# Patient Record
Sex: Male | Born: 1965 | Race: White | Hispanic: No | Marital: Married | Smoking: Current every day smoker
Health system: Southern US, Community
[De-identification: ages and names within clinical notes are randomized; demographics above are authoritative.]

## PROBLEM LIST (undated history)

## (undated) DIAGNOSIS — K759 Inflammatory liver disease, unspecified: Secondary | ICD-10-CM

## (undated) DIAGNOSIS — K429 Umbilical hernia without obstruction or gangrene: Secondary | ICD-10-CM

## (undated) HISTORY — PX: BACK SURGERY: SHX140

## (undated) HISTORY — PX: APPENDECTOMY: SHX54

## (undated) HISTORY — PX: COLONOSCOPY W/ POLYPECTOMY: SHX1380

## (undated) HISTORY — PX: WRIST SURGERY: SHX841

## (undated) HISTORY — PX: KNEE SURGERY: SHX244

## (undated) HISTORY — PX: FACIAL RECONSTRUCTION SURGERY: SHX631

---

## 2009-05-29 ENCOUNTER — Ambulatory Visit (HOSPITAL_COMMUNITY): Admission: RE | Admit: 2009-05-29 | Discharge: 2009-05-29 | Payer: Self-pay | Admitting: Neurosurgery

## 2009-06-13 ENCOUNTER — Ambulatory Visit (HOSPITAL_COMMUNITY): Admission: RE | Admit: 2009-06-13 | Discharge: 2009-06-13 | Payer: Self-pay | Admitting: Neurosurgery

## 2009-07-27 ENCOUNTER — Emergency Department (HOSPITAL_COMMUNITY): Admission: EM | Admit: 2009-07-27 | Discharge: 2009-07-27 | Payer: Self-pay | Admitting: Emergency Medicine

## 2010-07-09 LAB — VALPROIC ACID LEVEL: Valproic Acid Lvl: 38.3 ug/mL — ABNORMAL LOW (ref 50.0–100.0)

## 2010-07-10 LAB — BASIC METABOLIC PANEL
BUN: 8 mg/dL (ref 6–23)
Calcium: 9.5 mg/dL (ref 8.4–10.5)
Creatinine, Ser: 1.14 mg/dL (ref 0.4–1.5)
GFR calc non Af Amer: >60 mL/min (ref >60)
Potassium: 4.8 mEq/L (ref 3.5–5.1)

## 2010-07-10 LAB — APTT: aPTT: 30 seconds (ref 24–37)

## 2010-07-10 LAB — URINALYSIS, ROUTINE W REFLEX MICROSCOPIC
Protein, ur: NEGATIVE mg/dL
Urobilinogen, UA: 0.2 mg/dL (ref 0.0–1.0)

## 2010-07-10 LAB — SURGICAL PCR SCREEN: MRSA, PCR: NEGATIVE

## 2010-07-10 LAB — CBC: WBC: 12.1 10*3/uL — ABNORMAL HIGH (ref 4.0–10.5)

## 2010-07-10 LAB — PROTIME-INR: INR: 0.98 (ref 0.00–1.49)

## 2010-10-07 IMAGING — CR DG LUMBAR SPINE 2-3V
1 series · 1 of 1 positions shown · non-contrast
Comparison: CT 05/29/2009

CLINICAL DATA: L4-5 decompression.

LUMBAR SPINE - 2-3 VIEW

[view not recorded]
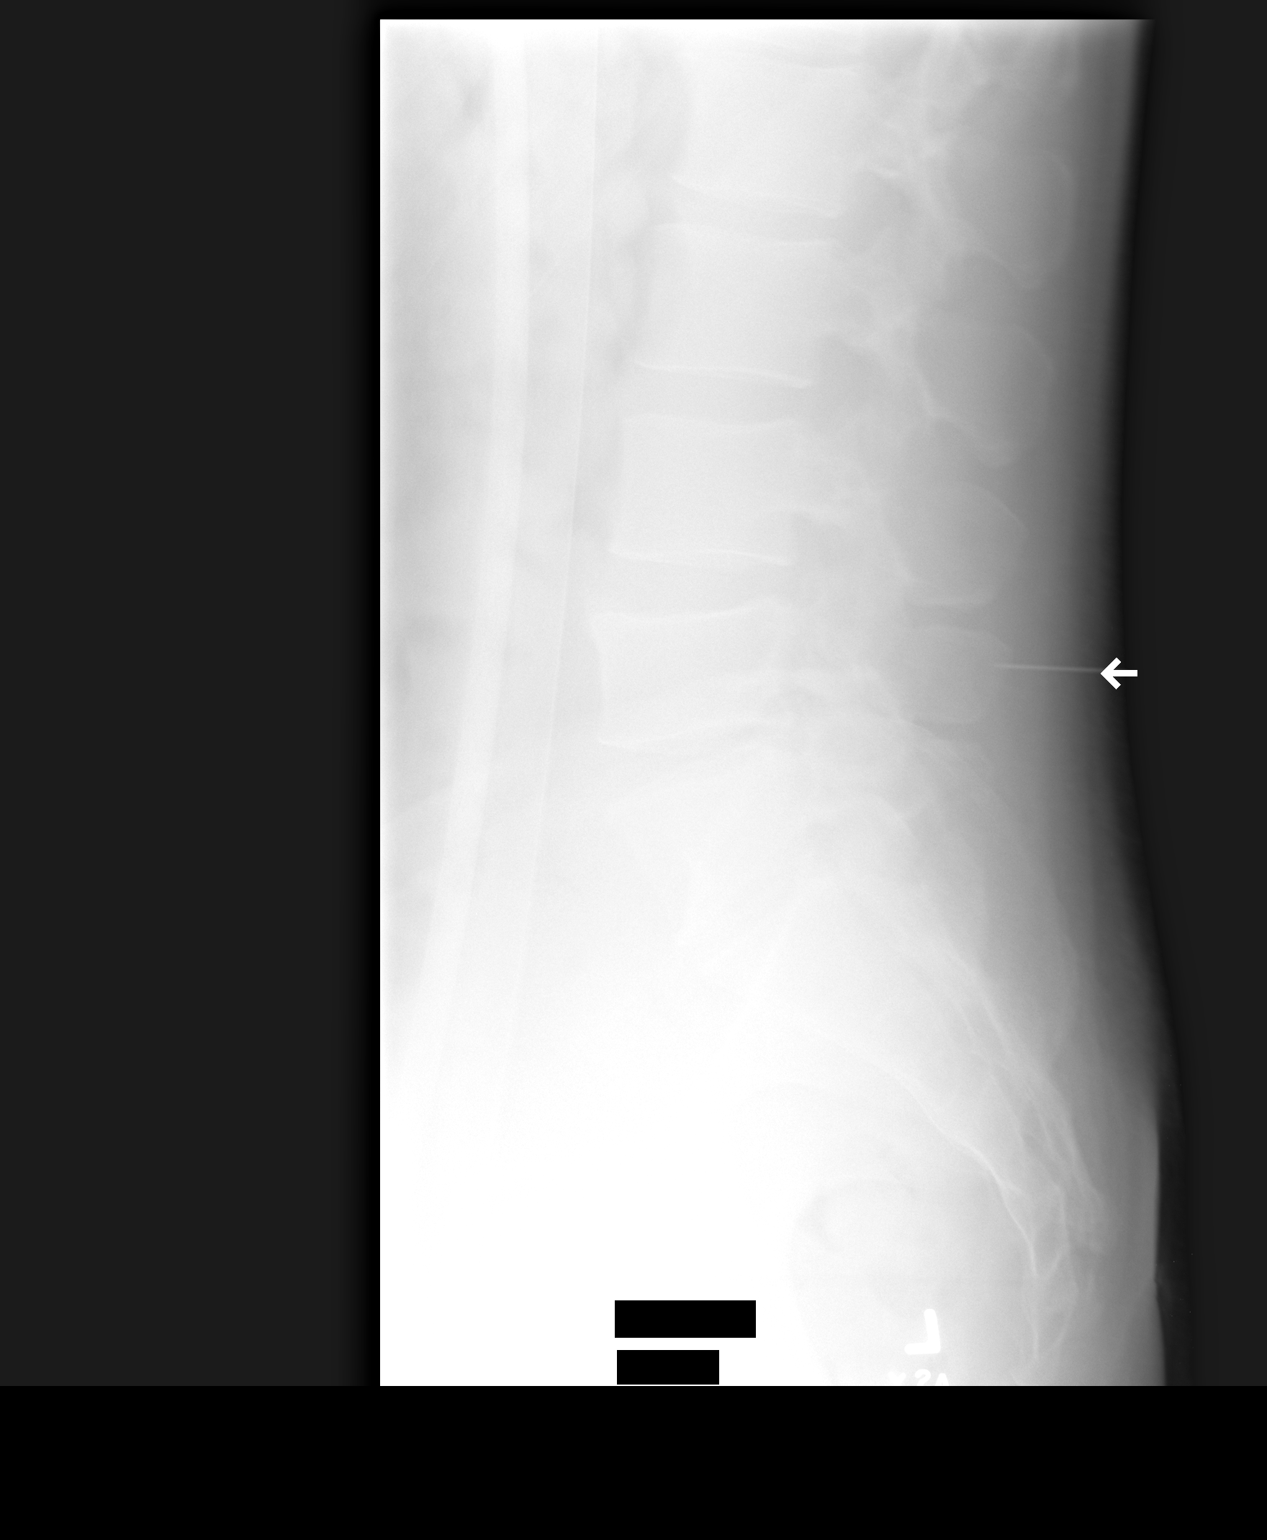

[1 of 1 positions shown; findings below may reference images not displayed]

FINDINGS: First lateral intraoperative image demonstrates a
posterior needle along the L4 spinous process.

Second lateral intraoperative image demonstrates posterior surgical
instruments at L4-5.
IMPRESSION: Intraoperative localization as above.

## 2018-02-15 DIAGNOSIS — T50901A Poisoning by unspecified drugs, medicaments and biological substances, accidental (unintentional), initial encounter: Secondary | ICD-10-CM

## 2018-02-15 DIAGNOSIS — F1910 Other psychoactive substance abuse, uncomplicated: Secondary | ICD-10-CM

## 2018-02-15 DIAGNOSIS — R45851 Suicidal ideations: Secondary | ICD-10-CM

## 2018-02-15 DIAGNOSIS — T50902A Poisoning by unspecified drugs, medicaments and biological substances, intentional self-harm, initial encounter: Secondary | ICD-10-CM

## 2018-05-06 ENCOUNTER — Emergency Department (HOSPITAL_COMMUNITY): Payer: Self-pay

## 2018-05-06 ENCOUNTER — Encounter (HOSPITAL_COMMUNITY): Payer: Self-pay

## 2018-05-06 ENCOUNTER — Observation Stay (HOSPITAL_COMMUNITY)
Admission: EM | Admit: 2018-05-06 | Discharge: 2018-05-08 | Disposition: A | Discharge status: Home / Self Care | Payer: Self-pay | Attending: General Surgery | Admitting: General Surgery

## 2018-05-06 ENCOUNTER — Other Ambulatory Visit: Payer: Self-pay

## 2018-05-06 DIAGNOSIS — S0285XA Fracture of orbit, unspecified, initial encounter for closed fracture: Secondary | ICD-10-CM

## 2018-05-06 DIAGNOSIS — S0083XA Contusion of other part of head, initial encounter: Secondary | ICD-10-CM

## 2018-05-06 DIAGNOSIS — T148XXA Other injury of unspecified body region, initial encounter: Secondary | ICD-10-CM

## 2018-05-06 DIAGNOSIS — S0231XA Fracture of orbital floor, right side, initial encounter for closed fracture: Secondary | ICD-10-CM | POA: Insufficient documentation

## 2018-05-06 DIAGNOSIS — S0181XA Laceration without foreign body of other part of head, initial encounter: Secondary | ICD-10-CM

## 2018-05-06 DIAGNOSIS — S022XXA Fracture of nasal bones, initial encounter for closed fracture: Principal | ICD-10-CM | POA: Diagnosis present

## 2018-05-06 LAB — COMPREHENSIVE METABOLIC PANEL
ALT: 37 U/L (ref 0–44)
ANION GAP: 10 (ref 5–15)
AST: 50 U/L — ABNORMAL HIGH (ref 15–41)
Albumin: 3.8 g/dL (ref 3.5–5.0)
Alkaline Phosphatase: 84 U/L (ref 38–126)
BUN: 14 mg/dL (ref 6–20)
CO2: 24 mmol/L (ref 22–32)
Calcium: 9.5 mg/dL (ref 8.9–10.3)
Chloride: 109 mmol/L (ref 98–111)
Creatinine, Ser: 1.22 mg/dL (ref 0.61–1.24)
GFR calc Af Amer: >60 mL/min (ref >60)
GFR calc non Af Amer: >60 mL/min (ref >60)
Glucose, Bld: 110 mg/dL — ABNORMAL HIGH (ref 70–99)
Potassium: 3.9 mmol/L (ref 3.5–5.1)
Sodium: 143 mmol/L (ref 135–145)
Total Bilirubin: 0.6 mg/dL (ref 0.3–1.2)
Total Protein: 6.6 g/dL (ref 6.5–8.1)

## 2018-05-06 LAB — ETHANOL: Alcohol, Ethyl (B): <10 mg/dL (ref <10)

## 2018-05-06 LAB — I-STAT CHEM 8, ED
BUN: 14 mg/dL (ref 6–20)
Calcium, Ion: 1.21 mmol/L (ref 1.15–1.40)
Chloride: 106 mmol/L (ref 98–111)
Creatinine, Ser: 1.1 mg/dL (ref 0.61–1.24)
Glucose, Bld: 103 mg/dL — ABNORMAL HIGH (ref 70–99)
HCT: 45 % (ref 39.0–52.0)
Hemoglobin: 15.3 g/dL (ref 13.0–17.0)
Potassium: 3.8 mmol/L (ref 3.5–5.1)
Sodium: 143 mmol/L (ref 135–145)
TCO2: 24 mmol/L (ref 22–32)

## 2018-05-06 LAB — CBC
HCT: 45.6 % (ref 39.0–52.0)
HEMOGLOBIN: 15 g/dL (ref 13.0–17.0)
MCH: 29.8 pg (ref 26.0–34.0)
MCHC: 32.9 g/dL (ref 30.0–36.0)
MCV: 90.7 fL (ref 80.0–100.0)
Platelets: 271 10*3/uL (ref 150–400)
RBC: 5.03 MIL/uL (ref 4.22–5.81)
RDW: 12.9 % (ref 11.5–15.5)
WBC: 21.1 10*3/uL — ABNORMAL HIGH (ref 4.0–10.5)
nRBC: 0 % (ref 0.0–0.2)

## 2018-05-06 LAB — PROTIME-INR
INR: 0.94
Prothrombin Time: 12.5 seconds (ref 11.4–15.2)

## 2018-05-06 LAB — SAMPLE TO BLOOD BANK

## 2018-05-06 LAB — I-STAT CG4 LACTIC ACID, ED: Lactic Acid, Venous: 2.36 mmol/L (ref 0.5–1.9)

## 2018-05-06 LAB — CDS SEROLOGY

## 2018-05-06 MED ORDER — IOPAMIDOL (ISOVUE-370) INJECTION 76%
INTRAVENOUS | Status: AC
  Filled 2018-05-06: qty 100

## 2018-05-06 MED ORDER — CEFAZOLIN SODIUM-DEXTROSE 2-4 GM/100ML-% IV SOLN
2.0000 g | Freq: Once | INTRAVENOUS | Status: AC
  Administered 2018-05-07: 2 g via INTRAVENOUS
  Filled 2018-05-06: qty 100

## 2018-05-06 MED ORDER — HYDROMORPHONE HCL 1 MG/ML IJ SOLN
INTRAMUSCULAR | Status: AC
  Administered 2018-05-06: 1 mg
  Filled 2018-05-06: qty 1

## 2018-05-06 MED ORDER — IOPAMIDOL (ISOVUE-370) INJECTION 76%
50.0000 mL | Freq: Once | INTRAVENOUS | Status: AC | PRN
  Administered 2018-05-06: 50 mL via INTRAVENOUS
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
## 2018-05-06 NOTE — ED Notes (Signed)
Dr Jacqulyn Bath informed of lactic acid results 2.36

## 2018-05-07 ENCOUNTER — Emergency Department (HOSPITAL_COMMUNITY): Payer: Self-pay

## 2018-05-07 DIAGNOSIS — S022XXA Fracture of nasal bones, initial encounter for closed fracture: Secondary | ICD-10-CM | POA: Diagnosis present

## 2018-05-07 LAB — CBC
HCT: 44.7 % (ref 39.0–52.0)
Hemoglobin: 15 g/dL (ref 13.0–17.0)
MCH: 30.9 pg (ref 26.0–34.0)
MCHC: 33.6 g/dL (ref 30.0–36.0)
MCV: 92.2 fL (ref 80.0–100.0)
Platelets: 271 10*3/uL (ref 150–400)
RBC: 4.85 MIL/uL (ref 4.22–5.81)
RDW: 13 % (ref 11.5–15.5)
WBC: 19.7 10*3/uL — ABNORMAL HIGH (ref 4.0–10.5)
nRBC: 0 % (ref 0.0–0.2)

## 2018-05-07 LAB — LIPASE, BLOOD: LIPASE: 79 U/L — AB (ref 11–51)

## 2018-05-07 LAB — URINALYSIS, ROUTINE W REFLEX MICROSCOPIC
Bilirubin Urine: NEGATIVE
Glucose, UA: NEGATIVE mg/dL
Hgb urine dipstick: NEGATIVE
KETONES UR: NEGATIVE mg/dL
Leukocytes, UA: NEGATIVE
Nitrite: NEGATIVE
Protein, ur: NEGATIVE mg/dL
Specific Gravity, Urine: 1.036 — ABNORMAL HIGH (ref 1.005–1.030)
pH: 5 (ref 5.0–8.0)

## 2018-05-07 LAB — HIV ANTIBODY (ROUTINE TESTING W REFLEX): HIV Screen 4th Generation wRfx: NONREACTIVE

## 2018-05-07 MED ORDER — HYDROMORPHONE HCL 1 MG/ML IJ SOLN
1.0000 mg | Freq: Once | INTRAMUSCULAR | Status: AC
  Administered 2018-05-07: 1 mg via INTRAVENOUS
  Filled 2018-05-07: qty 1

## 2018-05-07 MED ORDER — IOPAMIDOL (ISOVUE-370) INJECTION 76%
50.0000 mL | Freq: Once | INTRAVENOUS | Status: AC | PRN
  Administered 2018-05-07: 50 mL via INTRAVENOUS

## 2018-05-07 MED ORDER — MORPHINE SULFATE (PF) 2 MG/ML IV SOLN
2.0000 mg | INTRAVENOUS | Status: DC | PRN
  Administered 2018-05-07 – 2018-05-08 (×6): 2 mg via INTRAVENOUS
  Filled 2018-05-07: qty 2
  Filled 2018-05-07 (×5): qty 1

## 2018-05-07 MED ORDER — ENOXAPARIN SODIUM 40 MG/0.4ML ~~LOC~~ SOLN
40.0000 mg | Freq: Every day | SUBCUTANEOUS | Status: DC
  Administered 2018-05-07 – 2018-05-08 (×2): 40 mg via SUBCUTANEOUS
  Filled 2018-05-07 (×2): qty 0.4

## 2018-05-07 MED ORDER — LIDOCAINE-EPINEPHRINE (PF) 2 %-1:200000 IJ SOLN
20.0000 mL | Freq: Once | INTRAMUSCULAR | Status: AC
  Administered 2018-05-07: 20 mL via INTRADERMAL
  Filled 2018-05-07: qty 20

## 2018-05-07 MED ORDER — CHLORHEXIDINE GLUCONATE 0.12 % MT SOLN
15.0000 mL | Freq: Four times a day (QID) | OROMUCOSAL | Status: DC
  Administered 2018-05-07 – 2018-05-08 (×4): 15 mL via OROMUCOSAL
  Filled 2018-05-07 (×3): qty 15

## 2018-05-07 MED ORDER — IBUPROFEN 100 MG/5ML PO SUSP
800.0000 mg | Freq: Three times a day (TID) | ORAL | Status: DC | PRN
  Filled 2018-05-07: qty 40

## 2018-05-07 MED ORDER — ACETAMINOPHEN 160 MG/5ML PO SOLN
650.0000 mg | ORAL | Status: DC
  Administered 2018-05-07 – 2018-05-08 (×5): 650 mg via ORAL
  Filled 2018-05-07 (×5): qty 20.3

## 2018-05-07 MED ORDER — BACITRACIN ZINC 500 UNIT/GM EX OINT
TOPICAL_OINTMENT | Freq: Two times a day (BID) | CUTANEOUS | Status: DC
  Administered 2018-05-07 – 2018-05-08 (×3): via TOPICAL
  Filled 2018-05-07 (×2): qty 28.4

## 2018-05-07 MED ORDER — SODIUM CHLORIDE 0.9 % IV SOLN: INTRAVENOUS | Status: DC

## 2018-05-07 MED ORDER — WHITE PETROLATUM EX OINT
TOPICAL_OINTMENT | CUTANEOUS | Status: AC
  Administered 2018-05-07: 0.2
  Filled 2018-05-07: qty 28.35

## 2018-05-07 MED ORDER — OXYCODONE HCL 5 MG/5ML PO SOLN: 5.0000 mg | ORAL | Status: DC | PRN

## 2018-05-07 MED ORDER — HYDRALAZINE HCL 20 MG/ML IJ SOLN: 10.0000 mg | INTRAMUSCULAR | Status: DC | PRN

## 2018-05-07 MED ORDER — IOPAMIDOL (ISOVUE-370) INJECTION 76%: 50.0000 mL | Freq: Once | INTRAVENOUS | Status: DC | PRN

## 2018-05-07 MED ORDER — NICOTINE 7 MG/24HR TD PT24
7.0000 mg | MEDICATED_PATCH | Freq: Every day | TRANSDERMAL | Status: DC
  Administered 2018-05-07 – 2018-05-08 (×2): 7 mg via TRANSDERMAL
  Filled 2018-05-07 (×2): qty 1

## 2018-05-07 MED ORDER — ONDANSETRON 4 MG PO TBDP: 4.0000 mg | ORAL_TABLET | Freq: Four times a day (QID) | ORAL | Status: DC | PRN

## 2018-05-07 MED ORDER — OXYCODONE HCL 5 MG/5ML PO SOLN
5.0000 mg | ORAL | Status: DC | PRN
  Administered 2018-05-07 – 2018-05-08 (×4): 10 mg via ORAL
  Filled 2018-05-07 (×4): qty 10

## 2018-05-07 MED ORDER — ACETAMINOPHEN 325 MG PO TABS: 650.0000 mg | ORAL_TABLET | ORAL | Status: DC | PRN

## 2018-05-07 MED ORDER — IBUPROFEN 200 MG PO TABS: 800.0000 mg | ORAL_TABLET | Freq: Three times a day (TID) | ORAL | Status: DC | PRN

## 2018-05-07 MED ORDER — METOPROLOL TARTRATE 5 MG/5ML IV SOLN: 5.0000 mg | Freq: Four times a day (QID) | INTRAVENOUS | Status: DC | PRN

## 2018-05-07 MED ORDER — OXYCODONE HCL 5 MG PO TABS
5.0000 mg | ORAL_TABLET | ORAL | Status: DC | PRN
  Administered 2018-05-07: 5 mg via ORAL
  Filled 2018-05-07: qty 1

## 2018-05-07 MED ORDER — LIDOCAINE HCL (PF) 1 % IJ SOLN
INTRAMUSCULAR | Status: AC
  Administered 2018-05-07: 05:00:00
  Filled 2018-05-07: qty 30

## 2018-05-07 MED ORDER — ONDANSETRON HCL 4 MG/2ML IJ SOLN: 4.0000 mg | Freq: Four times a day (QID) | INTRAMUSCULAR | Status: DC | PRN

## 2018-05-07 MED ORDER — SODIUM CHLORIDE 0.9 % IV SOLN
INTRAVENOUS | Status: DC
  Administered 2018-05-07 (×3): via INTRAVENOUS

## 2018-05-07 MED ORDER — ACETAMINOPHEN 160 MG/5ML PO SOLN: 650.0000 mg | ORAL | Status: DC | PRN

## 2018-05-07 NOTE — Plan of Care (Signed)

## 2018-05-07 NOTE — ED Notes (Signed)
Pt unhooked himself from the monitors and demanded water or else he wanted to leave.  Pt made aware he needed to wait for the CT results prior to having any water.  Patient upset and stated he wants to leave.  Patient sitting on the side of the bed at this time. MD aware of situation.

## 2018-05-07 NOTE — Progress Notes (Signed)
Pt states he wants a cigarette. Informed this is a non-smoking hospital and I would call MD to ask for orders to leave floor. Staff member saw pt fully dressed and leaving unit. Staff member told pt he is not to leave floor without MD orders. Pt left building and whereabouts unknown. Notified security and charge nurse who are searching for pt now. Paged MD.

## 2018-05-07 NOTE — Progress Notes (Signed)
Pt located and returned to unit. States he pulled his own IV and trashed to so he could leave building and smoke a cigarette. States he is stressed about being homeless and recently stopped using illicit drugs. Pt offered emotional support and encouraged to stay within facility. Agrees to stay in hospital for care. SW request made. MD paged once more to notify of pt leaving facility.

## 2018-05-07 NOTE — ED Notes (Signed)
Dr. Ward at bedside.

## 2018-05-07 NOTE — Progress Notes (Signed)
Pt disconnected self from IVF. Discouraged such behavior and educated on purpose of IVF. Line flushed and IVF resumed. Upon return to room pt has once again disconnected himself from IVF. Refuses to keep IVF running. Disconnected and flushed line. Notified MD.

## 2018-05-07 NOTE — ED Notes (Signed)
Lab called and confirmed they can add on lipase.
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
## 2018-05-07 NOTE — ED Notes (Signed)
ED Provider at bedside. 

## 2018-05-07 NOTE — Progress Notes (Signed)
Pt was offered to be a on private status. Security came to bedside to discuss triple x option and pt declined stating no one would be to see him. Later overheard pt talking to someone on the phone informing them of his location and explaining his reason for admission. Did inform pt about safety procedures we can invoke if he feels unsafe. Pt up and ambulating without difficulty. Will continue to monitor.
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
## 2018-05-07 NOTE — ED Provider Notes (Signed)
LACERATION REPAIR Performed by: Arthor Captain Authorized by: Arthor Captain Consent: Verbal consent obtained. Risks and benefits: risks, benefits and alternatives were discussed Consent given by: patient Patient identity confirmed: provided demographic data Prepped and Draped in normal sterile fashion Wound explored  Laceration Location: R eyebrow  Laceration Length: 7 cm  No Foreign Bodies seen or palpated  Anesthesia: local infiltration  Local anesthetic: lidocaine 1% w epinephrine  Anesthetic total: 4 ml  Irrigation method: syringe Amount of cleaning: standard  Skin closure: 5.0 prolene  Number of sutures: 9  Technique: SI  Patient tolerance: Patient tolerated the procedure well with no immediate complications.   LACERATION REPAIR Performed by: Arthor Captain Authorized by: Arthor Captain Consent: Verbal consent obtained. Risks and benefits: risks, benefits and alternatives were discussed Consent given by: patient Patient identity confirmed: provided demographic data Prepped and Draped in normal sterile fashion Wound explored  Laceration Location: R cheek  Laceration Length: 2 cm  No Foreign Bodies seen or palpated  Anesthesia: local infiltration  Local anesthetic: lidocaine 1% w epinephrine  Anesthetic total: 1 ml  Irrigation method: syringe Amount of cleaning: standard  Skin closure: 5.0 prolene  Number of sutures: 1  Technique: SI  Patient tolerance: Patient tolerated the procedure well with no immediate complications.  LACERATION REPAIR Performed by: Arthor Captain Authorized by: Arthor Captain Consent: Verbal consent obtained. Risks and benefits: risks, benefits and alternatives were discussed Consent given by: patient Patient identity confirmed: provided demographic data Prepped and Draped in normal sterile fashion Wound explored  Laceration Location: R upper lip  Laceration Length: 4 cm  Intermediate laceration complexity -  stellate and involving the vermilion border  No Foreign Bodies seen or palpated  Anesthesia: local infiltration  Local anesthetic: lidocaine 1% w/ epinephrine  Anesthetic total: 3 ml  Irrigation method: syringe Amount of cleaning: standard  Skin closure: 5.0 prolene  Number of sutures: 5  Technique: SI  Patient tolerance: Patient tolerated the procedure well with no immediate complications.  LACERATION REPAIR Performed by: Arthor Captain Authorized by: Arthor Captain Consent: Verbal consent obtained. Risks and benefits: risks, benefits and alternatives were discussed Consent given by: patient Patient identity confirmed: provided demographic data Prepped and Draped in normal sterile fashion Wound explored  Laceration Location: Mid-upper lip  Laceration Length: 1cm  No Foreign Bodies seen or palpated  Anesthesia: local infiltration  Local anesthetic: lidocaine 1% w/ epinephrine  Anesthetic total: 1 ml  Irrigation method: syringe Amount of cleaning: standard  Skin closure: 5.0 prolene  Number of sutures: 1  Technique: SI  Patient tolerance: Patient tolerated the procedure well with no immediate complications.   LACERATION REPAIR Performed by: Arthor Captain Authorized by: Arthor Captain Consent: Verbal consent obtained. Risks and benefits: risks, benefits and alternatives were discussed Consent given by: patient Patient identity confirmed: provided demographic data Prepped and Draped in normal sterile fashion Wound explored  Laceration Location: left upper lip  Laceration Length: 3 cm  No Foreign Bodies seen or palpated  Anesthesia: local infiltration  Local anesthetic: lidocaine 1% w epinephrine  Anesthetic total: 2 ml  Irrigation method: syringe Amount of cleaning: standard  Skin closure: 5.0 prolene  Number of sutures: 4  Technique:si   Patient tolerance: Patient tolerated the procedure well with no immediate  complications.   LACERATION REPAIR Performed by: Arthor Captain Authorized by: Arthor Captain Consent: Verbal consent obtained. Risks and benefits: risks, benefits and alternatives were discussed Consent given by: patient Patient identity confirmed: provided demographic data Prepped and Draped in normal  sterile fashion Wound explored  Laceration Location: gingival surface of the upper lip  Laceration Length: 4 cm  No Foreign Bodies seen or palpated  Anesthesia: local infiltration  Local anesthetic: lidocaine 1% w/ epinephrine  Anesthetic total: 2 ml  Irrigation method: syringe Amount of cleaning: standard  Skin closure: 3.0 plain gut  Number of sutures: 1  Technique: SI  Patient tolerance: Patient tolerated the procedure well with no immediate complications.   LACERATION REPAIR Performed by: Arthor Captain Authorized by: Arthor Captain Consent: Verbal consent obtained. Risks and benefits: risks, benefits and alternatives were discussed Consent given by: patient Patient identity confirmed: provided demographic data Prepped and Draped in normal sterile fashion Wound explored  Laceration Location:  Nasal tip  Laceration Length: 0.5 cm  No Foreign Bodies seen or palpated  Anesthesia: local infiltration  Local anesthetic: lidocaine 1% w/o epinephrine  Anesthetic total: 1 ml  Irrigation method: syringe Amount of cleaning: standard  Skin closure: 6.0 prolene  Number of sutures: 1  Technique: SI  Patient tolerance: Patient tolerated the procedure well with no immediate complications.      Arthor Captain, PA-C 05/07/18 [phone]
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
## 2018-05-07 NOTE — ED Notes (Signed)
PA Harris remains at bedside suturing

## 2018-05-07 NOTE — ED Notes (Addendum)
PA Harris at bedside to suture lacerations, patient to be transported to inpatient bed after suturing is finished.

## 2018-05-07 NOTE — Progress Notes (Signed)
Spoke with MD and notified of pt exit of building. New orders obtained and noted.
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
## 2018-05-08 LAB — CBC
HCT: 39.4 % (ref 39.0–52.0)
Hemoglobin: 13 g/dL (ref 13.0–17.0)
MCH: 30 pg (ref 26.0–34.0)
MCHC: 33 g/dL (ref 30.0–36.0)
MCV: 91 fL (ref 80.0–100.0)
NRBC: 0 % (ref 0.0–0.2)
Platelets: 232 10*3/uL (ref 150–400)
RBC: 4.33 MIL/uL (ref 4.22–5.81)
RDW: 13 % (ref 11.5–15.5)
WBC: 10.3 10*3/uL (ref 4.0–10.5)

## 2018-05-08 LAB — BASIC METABOLIC PANEL
ANION GAP: 8 (ref 5–15)
BUN: 13 mg/dL (ref 6–20)
CO2: 25 mmol/L (ref 22–32)
Calcium: 8.3 mg/dL — ABNORMAL LOW (ref 8.9–10.3)
Chloride: 109 mmol/L (ref 98–111)
Creatinine, Ser: 1.05 mg/dL (ref 0.61–1.24)
GFR calc Af Amer: >60 mL/min (ref >60)
GFR calc non Af Amer: >60 mL/min (ref >60)
Glucose, Bld: 93 mg/dL (ref 70–99)
POTASSIUM: 3.8 mmol/L (ref 3.5–5.1)
Sodium: 142 mmol/L (ref 135–145)

## 2018-05-08 MED ORDER — BACITRACIN ZINC 500 UNIT/GM EX OINT: TOPICAL_OINTMENT | Freq: Two times a day (BID) | CUTANEOUS | 0 refills | Status: DC

## 2018-05-08 MED ORDER — ACETAMINOPHEN 500 MG PO TABS: 1000.0000 mg | ORAL_TABLET | Freq: Four times a day (QID) | ORAL | 2 refills | Status: AC | PRN

## 2018-05-08 MED ORDER — OXYCODONE HCL 5 MG PO TABS: 5.0000 mg | ORAL_TABLET | ORAL | 0 refills | Status: DC | PRN

## 2018-05-08 NOTE — Plan of Care (Signed)
  Problem: Pain Managment: Goal: General experience of comfort will improve Outcome: Progressing   Problem: Safety: Goal: Ability to remain free from injury will improve Outcome: Progressing   
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
## 2018-05-08 NOTE — Progress Notes (Signed)
   Plastic Surgery  Events noted. If patient able to discharge, I have made appt for patient in clinic 1.27.20 at 1 pm for suture removal and surgical planning.  Glenna FellowsBrinda Meli Faley, MD [facility] Plastic & Reconstructive Surgery [phone], pin [phone]

## 2018-05-08 NOTE — Discharge Instructions (Signed)
Ok to shower, vaseline or bacitracin to facial laceration repairs. Refrain from nose blowing, ok to use saline nasal spray to help with clots. Counseled patient edema will get worse for 48-72 hours prior to subsiding. Ice packs for comfort. Keep head elevated. Ok to brush teeth with soft tooth brush and rinse after each meal.

## 2021-08-15 ENCOUNTER — Ambulatory Visit (INDEPENDENT_AMBULATORY_CARE_PROVIDER_SITE_OTHER): Payer: Self-pay

## 2021-08-15 ENCOUNTER — Ambulatory Visit (INDEPENDENT_AMBULATORY_CARE_PROVIDER_SITE_OTHER): Payer: Self-pay | Admitting: Family Medicine

## 2021-08-15 VITALS — BP 160/88 | HR 91 | Ht 72.0 in | Wt 228.8 lb

## 2021-08-15 DIAGNOSIS — M795 Residual foreign body in soft tissue: Secondary | ICD-10-CM | POA: Insufficient documentation

## 2021-08-15 DIAGNOSIS — S63105A Unspecified dislocation of left thumb, initial encounter: Secondary | ICD-10-CM

## 2021-08-15 DIAGNOSIS — M79645 Pain in left finger(s): Secondary | ICD-10-CM

## 2021-08-15 MED ORDER — MELOXICAM 15 MG PO TABS: 15.0000 mg | ORAL_TABLET | Freq: Every day | ORAL | 0 refills | Status: AC
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
## 2021-08-15 NOTE — Patient Instructions (Addendum)
Thank you for coming in today.  ? ?I've referred you to Orthopedic Surgery.  Let us know if you don't hear from them in one week.  ? ?

## 2021-08-18 NOTE — Progress Notes (Signed)
The left thumb does look to be subluxed (partially out of the joint) to dislocated.  The splint that you are wearing is appropriate for now but I do think that you need to see a surgeon.

## 2021-08-19 ENCOUNTER — Encounter: Payer: Self-pay | Admitting: Orthopedic Surgery

## 2021-08-19 ENCOUNTER — Ambulatory Visit (INDEPENDENT_AMBULATORY_CARE_PROVIDER_SITE_OTHER): Payer: 59 | Admitting: Orthopedic Surgery

## 2021-08-19 DIAGNOSIS — S63045A Dislocation of carpometacarpal joint of left thumb, initial encounter: Secondary | ICD-10-CM | POA: Diagnosis not present

## 2021-08-25 DIAGNOSIS — S63045A Dislocation of carpometacarpal joint of left thumb, initial encounter: Secondary | ICD-10-CM | POA: Insufficient documentation
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
## 2021-09-02 ENCOUNTER — Telehealth: Payer: Self-pay | Admitting: Orthopedic Surgery

## 2021-09-02 NOTE — Telephone Encounter (Signed)
Patient called to let you know he is really hurting now. Patient changed his phone number, and forgot to let our office know. Please call patient to discuss next steps since it has been awhile since he was last seen. Patient's # [phone] ?

## 2021-09-02 NOTE — Telephone Encounter (Signed)
Please advise on the next steps. ?

## 2021-09-18 ENCOUNTER — Telehealth: Payer: Self-pay | Admitting: Orthopedic Surgery

## 2021-09-18 NOTE — Telephone Encounter (Signed)
Patient left another message stating he was calling back since surgery has not been scheduled. Please call patient to discuss increased symptoms, and if scheduling surgery is next step.

## 2021-09-19 ENCOUNTER — Encounter (HOSPITAL_COMMUNITY): Payer: Self-pay | Admitting: Orthopedic Surgery

## 2021-09-19 ENCOUNTER — Other Ambulatory Visit: Payer: Self-pay

## 2021-09-19 NOTE — Progress Notes (Signed)
Mr. Engelhard denies chest pain or shortness of breath.  Patient denies having any s/s of Covid in his household.  Patient denies any known exposure to Covid.  Mr Terry do not have a PCP.

## 2021-09-22 ENCOUNTER — Encounter (HOSPITAL_COMMUNITY): Payer: Self-pay | Admitting: Orthopedic Surgery

## 2021-09-22 ENCOUNTER — Ambulatory Visit (HOSPITAL_COMMUNITY): Payer: 59

## 2021-09-22 ENCOUNTER — Other Ambulatory Visit: Payer: Self-pay

## 2021-09-22 ENCOUNTER — Encounter (HOSPITAL_COMMUNITY)
Admission: RE | Disposition: A | Discharge status: Home / Self Care | Payer: Self-pay | Source: Home / Self Care | Attending: Orthopedic Surgery

## 2021-09-22 ENCOUNTER — Ambulatory Visit (HOSPITAL_BASED_OUTPATIENT_CLINIC_OR_DEPARTMENT_OTHER): Payer: 59 | Admitting: Anesthesiology

## 2021-09-22 ENCOUNTER — Ambulatory Visit (HOSPITAL_COMMUNITY): Payer: 59 | Admitting: Anesthesiology

## 2021-09-22 ENCOUNTER — Ambulatory Visit (HOSPITAL_COMMUNITY)
Admission: RE | Admit: 2021-09-22 | Discharge: 2021-09-22 | Disposition: A | Discharge status: Home / Self Care | Payer: 59 | Attending: Orthopedic Surgery | Admitting: Orthopedic Surgery

## 2021-09-22 DIAGNOSIS — S63046A Dislocation of carpometacarpal joint of unspecified thumb, initial encounter: Secondary | ICD-10-CM | POA: Insufficient documentation

## 2021-09-22 DIAGNOSIS — Y92410 Unspecified street and highway as the place of occurrence of the external cause: Secondary | ICD-10-CM | POA: Insufficient documentation

## 2021-09-22 DIAGNOSIS — F17210 Nicotine dependence, cigarettes, uncomplicated: Secondary | ICD-10-CM | POA: Diagnosis not present

## 2021-09-22 DIAGNOSIS — E669 Obesity, unspecified: Secondary | ICD-10-CM

## 2021-09-22 DIAGNOSIS — S63045A Dislocation of carpometacarpal joint of left thumb, initial encounter: Secondary | ICD-10-CM

## 2021-09-22 DIAGNOSIS — Z6831 Body mass index (BMI) 31.0-31.9, adult: Secondary | ICD-10-CM | POA: Diagnosis not present

## 2021-09-22 HISTORY — PX: CLOSED REDUCTION FINGER WITH PERCUTANEOUS PINNING: SHX5612

## 2021-09-22 HISTORY — DX: Inflammatory liver disease, unspecified: K75.9

## 2021-09-22 HISTORY — DX: Umbilical hernia without obstruction or gangrene: K42.9

## 2021-09-22 LAB — COMPREHENSIVE METABOLIC PANEL
ALT: 33 U/L (ref 0–44)
AST: 37 U/L (ref 15–41)
Albumin: 3.5 g/dL (ref 3.5–5.0)
Alkaline Phosphatase: 106 U/L (ref 38–126)
Anion gap: 7 (ref 5–15)
BUN: 12 mg/dL (ref 6–20)
CO2: 22 mmol/L (ref 22–32)
Calcium: 8.9 mg/dL (ref 8.9–10.3)
Chloride: 111 mmol/L (ref 98–111)
Creatinine, Ser: 1.03 mg/dL (ref 0.61–1.24)
GFR, Estimated: >60 mL/min (ref >60)
Glucose, Bld: 98 mg/dL (ref 70–99)
Potassium: 4.6 mmol/L (ref 3.5–5.1)
Sodium: 140 mmol/L (ref 135–145)
Total Bilirubin: 0.8 mg/dL (ref 0.3–1.2)
Total Protein: 6.5 g/dL (ref 6.5–8.1)

## 2021-09-22 LAB — CBC
HCT: 50.4 % (ref 39.0–52.0)
Hemoglobin: 17.1 g/dL — ABNORMAL HIGH (ref 13.0–17.0)
MCH: 30.4 pg (ref 26.0–34.0)
MCHC: 33.9 g/dL (ref 30.0–36.0)
MCV: 89.5 fL (ref 80.0–100.0)
Platelets: 298 10*3/uL (ref 150–400)
RBC: 5.63 MIL/uL (ref 4.22–5.81)
RDW: 13.9 % (ref 11.5–15.5)
WBC: 9.9 10*3/uL (ref 4.0–10.5)
nRBC: 0 % (ref 0.0–0.2)

## 2021-09-22 SURGERY — CLOSED REDUCTION, FINGER, WITH PERCUTANEOUS PINNING
Anesthesia: General | Site: Thumb | Laterality: Left

## 2021-09-22 MED ORDER — PROPOFOL 10 MG/ML IV BOLUS
INTRAVENOUS | Status: DC | PRN
  Administered 2021-09-22: 300 mg via INTRAVENOUS

## 2021-09-22 MED ORDER — OXYCODONE HCL 5 MG PO TABS: 5.0000 mg | ORAL_TABLET | ORAL | 0 refills | Status: DC | PRN

## 2021-09-22 MED ORDER — FENTANYL CITRATE (PF) 250 MCG/5ML IJ SOLN
INTRAMUSCULAR | Status: AC
  Filled 2021-09-22: qty 5

## 2021-09-22 MED ORDER — HYDROMORPHONE HCL 1 MG/ML IJ SOLN
0.2500 mg | INTRAMUSCULAR | Status: DC | PRN
  Administered 2021-09-22 (×4): 0.5 mg via INTRAVENOUS

## 2021-09-22 MED ORDER — ONDANSETRON HCL 4 MG/2ML IJ SOLN
INTRAMUSCULAR | Status: DC | PRN
  Administered 2021-09-22: 4 mg via INTRAVENOUS

## 2021-09-22 MED ORDER — DEXMEDETOMIDINE HCL IN NACL 80 MCG/20ML IV SOLN
INTRAVENOUS | Status: AC
  Filled 2021-09-22: qty 20

## 2021-09-22 MED ORDER — MIDAZOLAM HCL 2 MG/2ML IJ SOLN
INTRAMUSCULAR | Status: AC
  Filled 2021-09-22: qty 2

## 2021-09-22 MED ORDER — ONDANSETRON HCL 4 MG/2ML IJ SOLN
INTRAMUSCULAR | Status: AC
  Filled 2021-09-22: qty 2

## 2021-09-22 MED ORDER — LACTATED RINGERS IV SOLN: INTRAVENOUS | Status: DC

## 2021-09-22 MED ORDER — CEFAZOLIN SODIUM-DEXTROSE 2-4 GM/100ML-% IV SOLN
2.0000 g | INTRAVENOUS | Status: AC
  Administered 2021-09-22: 2 g via INTRAVENOUS
  Filled 2021-09-22: qty 100

## 2021-09-22 MED ORDER — HYDROMORPHONE HCL 1 MG/ML IJ SOLN
INTRAMUSCULAR | Status: AC
  Filled 2021-09-22: qty 1

## 2021-09-22 MED ORDER — ACETAMINOPHEN 10 MG/ML IV SOLN
INTRAVENOUS | Status: AC
  Filled 2021-09-22: qty 100

## 2021-09-22 MED ORDER — OXYCODONE HCL 5 MG PO TABS: 5.0000 mg | ORAL_TABLET | Freq: Once | ORAL | Status: AC | PRN

## 2021-09-22 MED ORDER — PROMETHAZINE HCL 25 MG/ML IJ SOLN: 6.2500 mg | INTRAMUSCULAR | Status: DC | PRN

## 2021-09-22 MED ORDER — PROPOFOL 10 MG/ML IV BOLUS
INTRAVENOUS | Status: AC
  Filled 2021-09-22: qty 20

## 2021-09-22 MED ORDER — MIDAZOLAM HCL 2 MG/2ML IJ SOLN
INTRAMUSCULAR | Status: DC | PRN
  Administered 2021-09-22: 2 mg via INTRAVENOUS

## 2021-09-22 MED ORDER — CHLORHEXIDINE GLUCONATE 0.12 % MT SOLN
15.0000 mL | Freq: Once | OROMUCOSAL | Status: AC
  Administered 2021-09-22: 15 mL via OROMUCOSAL
  Filled 2021-09-22: qty 15

## 2021-09-22 MED ORDER — DEXMEDETOMIDINE (PRECEDEX) IN NS 20 MCG/5ML (4 MCG/ML) IV SYRINGE
PREFILLED_SYRINGE | INTRAVENOUS | Status: DC | PRN
  Administered 2021-09-22: 12 ug via INTRAVENOUS

## 2021-09-22 MED ORDER — LIDOCAINE 2% (20 MG/ML) 5 ML SYRINGE
INTRAMUSCULAR | Status: DC | PRN
  Administered 2021-09-22: 60 mg via INTRAVENOUS

## 2021-09-22 MED ORDER — OXYCODONE HCL 5 MG/5ML PO SOLN
ORAL | Status: AC
  Filled 2021-09-22: qty 5

## 2021-09-22 MED ORDER — ORAL CARE MOUTH RINSE: 15.0000 mL | Freq: Once | OROMUCOSAL | Status: AC

## 2021-09-22 MED ORDER — DEXAMETHASONE SODIUM PHOSPHATE 10 MG/ML IJ SOLN
INTRAMUSCULAR | Status: DC | PRN
  Administered 2021-09-22: 10 mg via INTRAVENOUS

## 2021-09-22 MED ORDER — AMISULPRIDE (ANTIEMETIC) 5 MG/2ML IV SOLN: 10.0000 mg | Freq: Once | INTRAVENOUS | Status: DC | PRN

## 2021-09-22 MED ORDER — MEPERIDINE HCL 25 MG/ML IJ SOLN: 6.2500 mg | INTRAMUSCULAR | Status: DC | PRN

## 2021-09-22 MED ORDER — DEXAMETHASONE SODIUM PHOSPHATE 10 MG/ML IJ SOLN
INTRAMUSCULAR | Status: AC
  Filled 2021-09-22: qty 1

## 2021-09-22 MED ORDER — LIDOCAINE 2% (20 MG/ML) 5 ML SYRINGE
INTRAMUSCULAR | Status: AC
Start: 2021-09-22 — End: ?
  Filled 2021-09-22: qty 5

## 2021-09-22 MED ORDER — OXYCODONE HCL 5 MG/5ML PO SOLN
5.0000 mg | Freq: Once | ORAL | Status: AC | PRN
  Administered 2021-09-22: 5 mg via ORAL

## 2021-09-22 MED ORDER — FENTANYL CITRATE (PF) 250 MCG/5ML IJ SOLN
INTRAMUSCULAR | Status: DC | PRN
  Administered 2021-09-22 (×4): 50 ug via INTRAVENOUS

## 2021-09-22 MED ORDER — 0.9 % SODIUM CHLORIDE (POUR BTL) OPTIME
TOPICAL | Status: DC | PRN
  Administered 2021-09-22: 1000 mL

## 2021-09-22 MED ORDER — ACETAMINOPHEN 10 MG/ML IV SOLN
INTRAVENOUS | Status: DC | PRN
  Administered 2021-09-22: 1000 mg via INTRAVENOUS

## 2021-09-22 SURGICAL SUPPLY — 46 items
BAG COUNTER SPONGE SURGICOUNT (BAG) ×1 IMPLANT
BLADE CLIPPER SURG (BLADE) IMPLANT
BLADE SURG 15 STRL LF DISP TIS (BLADE) IMPLANT
BLADE SURG 15 STRL SS (BLADE) ×1
BNDG ELASTIC 3X5.8 VLCR STR LF (GAUZE/BANDAGES/DRESSINGS) ×1 IMPLANT
BNDG ELASTIC 4X5.8 VLCR STR LF (GAUZE/BANDAGES/DRESSINGS) ×2 IMPLANT
BNDG ESMARK 4X9 LF (GAUZE/BANDAGES/DRESSINGS) ×2 IMPLANT
BNDG GAUZE ELAST 4 BULKY (GAUZE/BANDAGES/DRESSINGS) ×1 IMPLANT
CORD BIPOLAR FORCEPS 12FT (ELECTRODE) ×2 IMPLANT
COVER SURGICAL LIGHT HANDLE (MISCELLANEOUS) ×2 IMPLANT
CUFF TOURN SGL QUICK 18X4 (TOURNIQUET CUFF) ×2 IMPLANT
CUFF TOURN SGL QUICK 24 (TOURNIQUET CUFF)
CUFF TRNQT CYL 24X4X16.5-23 (TOURNIQUET CUFF) IMPLANT
DRAPE OEC MINIVIEW 54X84 (DRAPES) ×2 IMPLANT
DRSG TEGADERM 4X4.75 (GAUZE/BANDAGES/DRESSINGS) ×1 IMPLANT
ELECT REM PT RETURN 9FT ADLT (ELECTROSURGICAL)
ELECTRODE REM PT RTRN 9FT ADLT (ELECTROSURGICAL) IMPLANT
GAUZE 4X4 16PLY ~~LOC~~+RFID DBL (SPONGE) ×2 IMPLANT
GAUZE SPONGE 4X4 12PLY STRL (GAUZE/BANDAGES/DRESSINGS) ×1 IMPLANT
GAUZE SPONGE 4X4 12PLY STRL LF (GAUZE/BANDAGES/DRESSINGS) ×1 IMPLANT
GAUZE XEROFORM 1X8 LF (GAUZE/BANDAGES/DRESSINGS) ×1 IMPLANT
GOWN STRL REUS W/ TWL LRG LVL3 (GOWN DISPOSABLE) ×2 IMPLANT
GOWN STRL REUS W/TWL LRG LVL3 (GOWN DISPOSABLE) ×2
K-WIRE .062 (WIRE)
K-WIRE 1.4X100 (WIRE)
K-WIRE FX7.25X.062XNS KRSH (WIRE)
K-WIRE SURGICAL 1.6X102 (WIRE) ×3 IMPLANT
KIT BASIN OR (CUSTOM PROCEDURE TRAY) ×2 IMPLANT
KIT TURNOVER KIT B (KITS) ×2 IMPLANT
KWIRE 1.4X100 (WIRE) IMPLANT
KWIRE FX7.25X.062XNS KRSH (WIRE) IMPLANT
NS IRRIG 1000ML POUR BTL (IV SOLUTION) ×2 IMPLANT
PACK ORTHO EXTREMITY (CUSTOM PROCEDURE TRAY) ×2 IMPLANT
PAD ARMBOARD 7.5X6 YLW CONV (MISCELLANEOUS) ×4 IMPLANT
PAD CAST 4YDX4 CTTN HI CHSV (CAST SUPPLIES) ×1 IMPLANT
PADDING CAST COTTON 4X4 STRL (CAST SUPPLIES) ×2
SPLINT PLASTER CAST XFAST 4X15 (CAST SUPPLIES) IMPLANT
SPLINT PLASTER XTRA FAST SET 4 (CAST SUPPLIES) ×1
SUCTION FRAZIER HANDLE 10FR (MISCELLANEOUS) ×1
SUCTION TUBE FRAZIER 10FR DISP (MISCELLANEOUS) ×1 IMPLANT
SUT ETHIBOND 4 0 TF (SUTURE) ×1 IMPLANT
SUT ETHILON 4 0 PS 2 18 (SUTURE) IMPLANT
TOWEL GREEN STERILE (TOWEL DISPOSABLE) ×2 IMPLANT
TOWEL GREEN STERILE FF (TOWEL DISPOSABLE) ×1 IMPLANT
TUBE CONNECTING 12X1/4 (SUCTIONS) ×1 IMPLANT
WATER STERILE IRR 1000ML POUR (IV SOLUTION) ×1 IMPLANT

## 2021-09-22 NOTE — Transfer of Care (Signed)
Immediate Anesthesia Transfer of Care Note  Patient: Nicholas Fischer  Procedure(s) Performed: OPEN REDUCTION AND PERCUTANEOUS PINNING OF LEFT THUMB CARPAL METACARPAL DISLOCATION (Left: Thumb)  Patient Location: PACU  Anesthesia Type:General  Level of Consciousness: drowsy  Airway & Oxygen Therapy: Patient Spontanous Breathing and Patient connected to face mask oxygen  Post-op Assessment: Report given to RN and Post -op Vital signs reviewed and stable  Post vital signs: Reviewed and stable  Last Vitals:  Vitals Value Taken Time  BP 153/95 09/22/21 1803  Temp    Pulse 77 09/22/21 1806  Resp 12 09/22/21 1806  SpO2 97 % 09/22/21 1806  Vitals shown include unvalidated device data.  Last Pain:  Vitals:   09/22/21 1347  PainSc: 10-Worst pain ever      Patients Stated Pain Goal: 0 (09/22/21 1347)  Complications: No notable events documented.

## 2021-09-22 NOTE — Interval H&P Note (Signed)
History and Physical Interval Note:  09/22/2021 4:08 PM  Nicholas Fischer  has presented today for surgery, with the diagnosis of left thumb carpometacarpal dislocation.  The various methods of treatment have been discussed with the patient and family. After consideration of risks, benefits and other options for treatment, the patient has consented to  Procedure(s): CLOSED VS OPEN REDUCTION LEFT THUMB CARPOMETACARPAL JOINT WITH PERCUTANEOUS PINNING (Left) as a surgical intervention.  The patient's history has been reviewed, patient examined, no change in status, stable for surgery.  I have reviewed the patient's chart and labs.  Questions were answered to the patient's satisfaction.     Lidie Glade Iley Breeden
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
## 2021-09-22 NOTE — Discharge Instructions (Signed)
Waylan Rocher, M.D. Hand Surgery  POST-OPERATIVE DISCHARGE INSTRUCTIONS   PRESCRIPTIONS: You may have been given a prescription to be taken as directed for post-operative pain control.  You may also take over the counter ibuprofen/aleve and tylenol for pain. Take this as directed on the packaging. Do not exceed 3000 mg tylenol/acetaminophen in 24 hours.  Ibuprofen 600-800 mg (3-4) tablets by mouth every 6 hours as needed for pain.  OR Aleve 2 tablets by mouth every 12 hours (twice daily) as needed for pain.  AND/OR Tylenol 1000 mg (2 tablets) every 8 hours as needed for pain.  Please use your pain medication carefully, as refills are limited and you may not be provided with one.  As stated above, please use over the counter pain medicine - it will also be helpful with decreasing your swelling.    ANESTHESIA: After your surgery, post-surgical discomfort or pain is likely. This discomfort can last several days to a few weeks. At certain times of the day your discomfort may be more intense.   Did you receive a nerve block?  A nerve block can provide pain relief for one hour to two days after your surgery. As long as the nerve block is working, you will experience little or no sensation in the area the surgeon operated on.  As the nerve block wears off, you will begin to experience pain or discomfort. It is very important that you begin taking your prescribed pain medication before the nerve block fully wears off. Treating your pain at the first sign of the block wearing off will ensure your pain is better controlled and more tolerable when full-sensation returns. Do not wait until the pain is intolerable, as the medicine will be less effective. It is better to treat pain in advance than to try and catch up.   General Anesthesia:  If you did not receive a nerve block during your surgery, you will need to start taking your pain medication shortly after your surgery and should continue  to do so as prescribed by your surgeon.     ICE AND ELEVATION: You may use ice for the first 48-72 hours, but it is not critical.   Motion of your fingers is very important to decrease the swelling. EXCEPT YOUR THUMB.  Elevation, as much as possible for the next 48 hours, is critical for decreasing swelling as well as for pain relief. Elevation means when you are seated or lying down, you hand should be at or above your heart. When walking, the hand needs to be at or above the level of your elbow.  If the bandage gets too tight, it may need to be loosened. Please contact our office and we will instruct you in how to do this.    SURGICAL BANDAGES:  Keep your dressing and/or splint clean and dry at all times.  Do not remove until you are seen again in the office.  If careful, you may place a plastic bag over your bandage and tape the end to shower, but be careful, do not get your bandages wet.     HAND THERAPY:  You may not need any. If you do, we will begin this at your follow up visit in the clinic.    ACTIVITY AND WORK: You are encouraged to move any fingers which are not in the bandage.  Light use of the fingers is allowed to assist the other hand with daily hygiene and eating, but strong gripping or lifting is  often uncomfortable and should be avoided.  You might miss a variable period of time from work and hopefully this issue has been discussed prior to surgery. You may not do any heavy work with your affected hand for about 2 weeks.    [facility] [address] [place],  [place]  [address] [phone]

## 2021-09-22 NOTE — Anesthesia Preprocedure Evaluation (Signed)
Anesthesia Evaluation  Patient identified by MRN, date of birth, ID band Patient awake    Reviewed: Allergy & Precautions, NPO status , Patient's Chart, lab work & pertinent test results  Airway Mallampati: II  TM Distance: >3 FB Neck ROM: Full    Dental no notable dental hx.    Pulmonary neg pulmonary ROS, Current Smoker and Patient abstained from smoking.,    Pulmonary exam normal breath sounds clear to auscultation       Cardiovascular negative cardio ROS Normal cardiovascular exam Rhythm:Regular Rate:Normal     Neuro/Psych negative neurological ROS  negative psych ROS   GI/Hepatic negative GI ROS, Neg liver ROS,   Endo/Other  negative endocrine ROS  Renal/GU negative Renal ROS  negative genitourinary   Musculoskeletal negative musculoskeletal ROS (+)   Abdominal (+) + obese,   Peds negative pediatric ROS (+)  Hematology negative hematology ROS (+)   Anesthesia Other Findings   Reproductive/Obstetrics negative OB ROS                             Anesthesia Physical Anesthesia Plan  ASA: 3  Anesthesia Plan: General   Post-op Pain Management: Minimal or no pain anticipated   Induction: Intravenous  PONV Risk Score and Plan: 1 and Ondansetron and Treatment may vary due to age or medical condition  Airway Management Planned: LMA  Additional Equipment:   Intra-op Plan:   Post-operative Plan: Extubation in OR  Informed Consent: I have reviewed the patients History and Physical, chart, labs and discussed the procedure including the risks, benefits and alternatives for the proposed anesthesia with the patient or authorized representative who has indicated his/her understanding and acceptance.     Dental advisory given  Plan Discussed with: CRNA  Anesthesia Plan Comments:         Anesthesia Quick Evaluation

## 2021-09-22 NOTE — Anesthesia Procedure Notes (Signed)
Procedure Name: LMA Insertion Date/Time: 09/22/2021 4:20 PM Performed by: Tressia Miners, CRNA Pre-anesthesia Checklist: Patient identified, Emergency Drugs available, Suction available and Patient being monitored Patient Re-evaluated:Patient Re-evaluated prior to induction Oxygen Delivery Method: Circle System Utilized Preoxygenation: Pre-oxygenation with 100% oxygen Induction Type: IV induction Ventilation: Mask ventilation without difficulty LMA: LMA inserted LMA Size: 5.0 Number of attempts: 1 Airway Equipment and Method: Bite block Placement Confirmation: positive ETCO2 Tube secured with: Tape Dental Injury: Teeth and Oropharynx as per pre-operative assessment

## 2021-09-23 ENCOUNTER — Telehealth: Payer: Self-pay | Admitting: Orthopedic Surgery

## 2021-09-23 ENCOUNTER — Other Ambulatory Visit: Payer: Self-pay | Admitting: Orthopedic Surgery

## 2021-09-23 ENCOUNTER — Encounter (HOSPITAL_COMMUNITY): Payer: Self-pay | Admitting: Orthopedic Surgery

## 2021-09-23 MED ORDER — OXYCODONE HCL 5 MG PO TABS: 5.0000 mg | ORAL_TABLET | ORAL | 0 refills | Status: DC | PRN

## 2021-09-23 MED ORDER — OXYCODONE HCL 10 MG PO TABS: 5.0000 mg | ORAL_TABLET | ORAL | 0 refills | Status: AC | PRN

## 2021-09-23 NOTE — Anesthesia Postprocedure Evaluation (Signed)
Anesthesia Post Note  Patient: Nicholas Fischer  Procedure(s) Performed: OPEN REDUCTION AND PERCUTANEOUS PINNING OF LEFT THUMB CARPAL METACARPAL DISLOCATION (Left: Thumb)     Patient location during evaluation: PACU Anesthesia Type: General Level of consciousness: awake and alert Pain management: pain level controlled Vital Signs Assessment: post-procedure vital signs reviewed and stable Respiratory status: spontaneous breathing, nonlabored ventilation, respiratory function stable and patient connected to nasal cannula oxygen Cardiovascular status: blood pressure returned to baseline and stable Postop Assessment: no apparent nausea or vomiting Anesthetic complications: no   No notable events documented.              Effie Berkshire

## 2021-09-23 NOTE — Telephone Encounter (Signed)
Pharmacy called in stating they do not have any 5 MG oxycodone but the have 10s, 15s and 20s that can fill the order please resend order in

## 2021-09-30 ENCOUNTER — Encounter: Payer: Self-pay | Admitting: Radiology

## 2021-10-02 ENCOUNTER — Ambulatory Visit (INDEPENDENT_AMBULATORY_CARE_PROVIDER_SITE_OTHER): Payer: 59 | Admitting: Orthopedic Surgery

## 2021-10-02 DIAGNOSIS — S63045A Dislocation of carpometacarpal joint of left thumb, initial encounter: Secondary | ICD-10-CM

## 2021-10-02 NOTE — Progress Notes (Addendum)
Called patient to discuss his thumb/wrist as he missed his appointment.  He describes significant swelling with "infection" draining from the incision.  One of the pins fell out as the swelling has improved overnight.  The pins were buried intraoperatively.  I stressed the importance of coming to the office today as it sounds like this needs to be seen and might need an irrigation and debridement.  Patient notes that he doesn't have a ride and has been trying to get a ride to the office or the ER since yesterday.  He will work on transportation to be seen.

## 2021-10-03 MED ORDER — OXYCODONE HCL 5 MG PO TABS: 5.0000 mg | ORAL_TABLET | Freq: Four times a day (QID) | ORAL | 0 refills | Status: DC | PRN

## 2021-10-03 NOTE — Addendum Note (Signed)
Addended by: Marlyne Beards on: 10/03/2021 07:48 PM   Modules accepted: Orders

## 2021-10-05 ENCOUNTER — Encounter (HOSPITAL_COMMUNITY): Payer: Self-pay

## 2021-10-05 ENCOUNTER — Inpatient Hospital Stay (HOSPITAL_COMMUNITY)
Admission: EM | Admit: 2021-10-05 | Discharge: 2021-10-07 | DRG: 857 | Discharge status: Against Medical Advice | Payer: 59 | Attending: Internal Medicine | Admitting: Internal Medicine

## 2021-10-05 ENCOUNTER — Encounter: Payer: Self-pay | Admitting: Orthopedic Surgery

## 2021-10-05 DIAGNOSIS — T8149XA Infection following a procedure, other surgical site, initial encounter: Principal | ICD-10-CM | POA: Diagnosis present

## 2021-10-05 DIAGNOSIS — L089 Local infection of the skin and subcutaneous tissue, unspecified: Secondary | ICD-10-CM | POA: Diagnosis present

## 2021-10-05 DIAGNOSIS — M79642 Pain in left hand: Secondary | ICD-10-CM | POA: Diagnosis not present

## 2021-10-05 DIAGNOSIS — M869 Osteomyelitis, unspecified: Secondary | ICD-10-CM

## 2021-10-05 DIAGNOSIS — Z560 Unemployment, unspecified: Secondary | ICD-10-CM

## 2021-10-05 DIAGNOSIS — Z5321 Procedure and treatment not carried out due to patient leaving prior to being seen by health care provider: Secondary | ICD-10-CM | POA: Diagnosis present

## 2021-10-05 DIAGNOSIS — M86141 Other acute osteomyelitis, right hand: Secondary | ICD-10-CM | POA: Diagnosis present

## 2021-10-05 DIAGNOSIS — B9689 Other specified bacterial agents as the cause of diseases classified elsewhere: Secondary | ICD-10-CM | POA: Diagnosis present

## 2021-10-05 DIAGNOSIS — R739 Hyperglycemia, unspecified: Secondary | ICD-10-CM | POA: Diagnosis present

## 2021-10-05 DIAGNOSIS — Y839 Surgical procedure, unspecified as the cause of abnormal reaction of the patient, or of later complication, without mention of misadventure at the time of the procedure: Secondary | ICD-10-CM | POA: Diagnosis present

## 2021-10-05 DIAGNOSIS — F17210 Nicotine dependence, cigarettes, uncomplicated: Secondary | ICD-10-CM | POA: Diagnosis present

## 2021-10-05 LAB — COMPREHENSIVE METABOLIC PANEL
ALT: 28 U/L (ref 0–44)
AST: 20 U/L (ref 15–41)
Albumin: 3.2 g/dL — ABNORMAL LOW (ref 3.5–5.0)
Alkaline Phosphatase: 111 U/L (ref 38–126)
Anion gap: 10 (ref 5–15)
BUN: 10 mg/dL (ref 6–20)
CO2: 22 mmol/L (ref 22–32)
Calcium: 9 mg/dL (ref 8.9–10.3)
Chloride: 103 mmol/L (ref 98–111)
Creatinine, Ser: 1.07 mg/dL (ref 0.61–1.24)
GFR, Estimated: >60 mL/min (ref >60)
Glucose, Bld: 120 mg/dL — ABNORMAL HIGH (ref 70–99)
Potassium: 3.7 mmol/L (ref 3.5–5.1)
Sodium: 135 mmol/L (ref 135–145)
Total Bilirubin: 0.4 mg/dL (ref 0.3–1.2)
Total Protein: 6.9 g/dL (ref 6.5–8.1)

## 2021-10-05 LAB — CBC WITH DIFFERENTIAL/PLATELET
Abs Immature Granulocytes: 0.12 10*3/uL — ABNORMAL HIGH (ref 0.00–0.07)
Basophils Absolute: 0.1 10*3/uL (ref 0.0–0.1)
Basophils Relative: 1 %
Eosinophils Absolute: 0.2 10*3/uL (ref 0.0–0.5)
Eosinophils Relative: 1 %
HCT: 47.6 % (ref 39.0–52.0)
Hemoglobin: 15.9 g/dL (ref 13.0–17.0)
Immature Granulocytes: 1 %
Lymphocytes Relative: 14 %
Lymphs Abs: 2.2 10*3/uL (ref 0.7–4.0)
MCH: 29.8 pg (ref 26.0–34.0)
MCHC: 33.4 g/dL (ref 30.0–36.0)
MCV: 89.3 fL (ref 80.0–100.0)
Monocytes Absolute: 1.2 10*3/uL — ABNORMAL HIGH (ref 0.1–1.0)
Monocytes Relative: 8 %
Neutro Abs: 11.5 10*3/uL — ABNORMAL HIGH (ref 1.7–7.7)
Neutrophils Relative %: 75 %
Platelets: 381 10*3/uL (ref 150–400)
RBC: 5.33 MIL/uL (ref 4.22–5.81)
RDW: 13.4 % (ref 11.5–15.5)
WBC: 15.3 10*3/uL — ABNORMAL HIGH (ref 4.0–10.5)
nRBC: 0 % (ref 0.0–0.2)

## 2021-10-05 NOTE — ED Provider Triage Note (Signed)
Emergency Medicine Provider Triage Evaluation Note  Nicholas Fischer , a 56 y.o. male  was evaluated in triage.  Pt complains of left wrist pain.  Patient was recently operated on by Dr. Frazier Butt.  Patient has noted consistent pus drainage from site as well as two pins that of come out as well.  He has been sending pictures to his orthopedic doctor and his doctor recommended coming to the hospital for potential admission and operation tomorrow.  He has not been on antibiotics.  Denies fever, chills, night sweats, chest pain, shortness of breath.  Review of Systems  Positive:  Negative: See above  Physical Exam  BP (!) 168/93 (BP Location: Right Arm)   Pulse (!) 101   Temp 98.7 F (37.1 C) (Oral)   Resp 16   SpO2 96%  Gen:   Awake, no distress   Resp:  Normal effort  MSK:   Moves extremities without difficulty  Other:  Erythema noted on the dorsal aspect of patient's left hand.  Active pus drainage noted from at least 3 sites.  Medical Decision Making  Medically screening exam initiated at 7:33 PM.  Appropriate orders placed.  Nicholas Fischer was informed that the remainder of the evaluation will be completed by another provider, this initial triage assessment does not replace that evaluation, and the importance of remaining in the ED until their evaluation is complete.     Peter Garter, [place] 10/05/21 1935

## 2021-10-05 NOTE — ED Triage Notes (Signed)
Pt bib with reports of having surgery 6 days ago. Pt removed cast due to the swelling he was experiencing. Pt rewrapped his wound, does endorse pus draining from wound.

## 2021-10-05 NOTE — Progress Notes (Signed)
Patient has reached out regarding a post op infection of his hand/thumb. I have encouraged patient to go to the ER for evaluation multiple times. Per patient, he has been trying to get a ride to the hospital for several days but has been unsuccessful. I instructed patient to go to the ER as soon as possible and that he may need to contact EMS if necessary.

## 2021-10-06 ENCOUNTER — Encounter (HOSPITAL_COMMUNITY)
Admission: EM | Discharge status: Against Medical Advice | Payer: Self-pay | Source: Home / Self Care | Attending: Internal Medicine

## 2021-10-06 ENCOUNTER — Inpatient Hospital Stay (HOSPITAL_COMMUNITY): Payer: 59 | Admitting: Anesthesiology

## 2021-10-06 ENCOUNTER — Encounter (HOSPITAL_COMMUNITY): Payer: Self-pay | Admitting: Internal Medicine

## 2021-10-06 ENCOUNTER — Other Ambulatory Visit: Payer: Self-pay

## 2021-10-06 ENCOUNTER — Emergency Department (HOSPITAL_COMMUNITY): Payer: 59

## 2021-10-06 DIAGNOSIS — M79642 Pain in left hand: Secondary | ICD-10-CM | POA: Diagnosis present

## 2021-10-06 DIAGNOSIS — M86141 Other acute osteomyelitis, right hand: Secondary | ICD-10-CM | POA: Diagnosis present

## 2021-10-06 DIAGNOSIS — S61402A Unspecified open wound of left hand, initial encounter: Secondary | ICD-10-CM | POA: Diagnosis not present

## 2021-10-06 DIAGNOSIS — T148XXA Other injury of unspecified body region, initial encounter: Secondary | ICD-10-CM

## 2021-10-06 DIAGNOSIS — Y839 Surgical procedure, unspecified as the cause of abnormal reaction of the patient, or of later complication, without mention of misadventure at the time of the procedure: Secondary | ICD-10-CM | POA: Diagnosis present

## 2021-10-06 DIAGNOSIS — F17210 Nicotine dependence, cigarettes, uncomplicated: Secondary | ICD-10-CM | POA: Diagnosis present

## 2021-10-06 DIAGNOSIS — S63042D Subluxation of carpometacarpal joint of left thumb, subsequent encounter: Secondary | ICD-10-CM

## 2021-10-06 DIAGNOSIS — L089 Local infection of the skin and subcutaneous tissue, unspecified: Secondary | ICD-10-CM | POA: Diagnosis present

## 2021-10-06 DIAGNOSIS — B9689 Other specified bacterial agents as the cause of diseases classified elsewhere: Secondary | ICD-10-CM | POA: Diagnosis present

## 2021-10-06 DIAGNOSIS — M86142 Other acute osteomyelitis, left hand: Secondary | ICD-10-CM | POA: Diagnosis not present

## 2021-10-06 DIAGNOSIS — Z560 Unemployment, unspecified: Secondary | ICD-10-CM | POA: Diagnosis not present

## 2021-10-06 DIAGNOSIS — Z5321 Procedure and treatment not carried out due to patient leaving prior to being seen by health care provider: Secondary | ICD-10-CM | POA: Diagnosis present

## 2021-10-06 DIAGNOSIS — T8149XA Infection following a procedure, other surgical site, initial encounter: Secondary | ICD-10-CM

## 2021-10-06 DIAGNOSIS — R739 Hyperglycemia, unspecified: Secondary | ICD-10-CM | POA: Diagnosis present

## 2021-10-06 HISTORY — PX: I & D EXTREMITY: SHX5045

## 2021-10-06 LAB — COMPREHENSIVE METABOLIC PANEL
ALT: 25 U/L (ref 0–44)
AST: 19 U/L (ref 15–41)
Albumin: 3 g/dL — ABNORMAL LOW (ref 3.5–5.0)
Alkaline Phosphatase: 109 U/L (ref 38–126)
Anion gap: 8 (ref 5–15)
BUN: 10 mg/dL (ref 6–20)
CO2: 23 mmol/L (ref 22–32)
Calcium: 9 mg/dL (ref 8.9–10.3)
Chloride: 106 mmol/L (ref 98–111)
Creatinine, Ser: 0.96 mg/dL (ref 0.61–1.24)
GFR, Estimated: >60 mL/min (ref >60)
Glucose, Bld: 109 mg/dL — ABNORMAL HIGH (ref 70–99)
Potassium: 4 mmol/L (ref 3.5–5.1)
Sodium: 137 mmol/L (ref 135–145)
Total Bilirubin: 0.8 mg/dL (ref 0.3–1.2)
Total Protein: 6.6 g/dL (ref 6.5–8.1)

## 2021-10-06 LAB — CBC WITH DIFFERENTIAL/PLATELET
Abs Immature Granulocytes: 0.13 10*3/uL — ABNORMAL HIGH (ref 0.00–0.07)
Basophils Absolute: 0.1 10*3/uL (ref 0.0–0.1)
Basophils Relative: 1 %
Eosinophils Absolute: 0.2 10*3/uL (ref 0.0–0.5)
Eosinophils Relative: 2 %
HCT: 47 % (ref 39.0–52.0)
Hemoglobin: 15.9 g/dL (ref 13.0–17.0)
Immature Granulocytes: 1 %
Lymphocytes Relative: 18 %
Lymphs Abs: 2.3 10*3/uL (ref 0.7–4.0)
MCH: 30.1 pg (ref 26.0–34.0)
MCHC: 33.8 g/dL (ref 30.0–36.0)
MCV: 89 fL (ref 80.0–100.0)
Monocytes Absolute: 1.1 10*3/uL — ABNORMAL HIGH (ref 0.1–1.0)
Monocytes Relative: 9 %
Neutro Abs: 8.8 10*3/uL — ABNORMAL HIGH (ref 1.7–7.7)
Neutrophils Relative %: 69 %
Platelets: 365 10*3/uL (ref 150–400)
RBC: 5.28 MIL/uL (ref 4.22–5.81)
RDW: 13.4 % (ref 11.5–15.5)
WBC: 12.5 10*3/uL — ABNORMAL HIGH (ref 4.0–10.5)
nRBC: 0 % (ref 0.0–0.2)

## 2021-10-06 LAB — MAGNESIUM: Magnesium: 1.9 mg/dL (ref 1.7–2.4)

## 2021-10-06 LAB — PHOSPHORUS: Phosphorus: 3.1 mg/dL (ref 2.5–4.6)

## 2021-10-06 SURGERY — IRRIGATION AND DEBRIDEMENT EXTREMITY
Anesthesia: General | Laterality: Left

## 2021-10-06 MED ORDER — ACETAMINOPHEN 500 MG PO TABS
1000.0000 mg | ORAL_TABLET | Freq: Once | ORAL | Status: AC
  Administered 2021-10-06: 1000 mg via ORAL
  Filled 2021-10-06: qty 2

## 2021-10-06 MED ORDER — CHLORHEXIDINE GLUCONATE 0.12 % MT SOLN
15.0000 mL | Freq: Once | OROMUCOSAL | Status: AC
  Administered 2021-10-06: 15 mL via OROMUCOSAL

## 2021-10-06 MED ORDER — HYDROMORPHONE HCL 1 MG/ML IJ SOLN
0.5000 mg | INTRAMUSCULAR | Status: DC | PRN
  Administered 2021-10-06 – 2021-10-07 (×4): 0.5 mg via INTRAVENOUS
  Filled 2021-10-06: qty 0.5
  Filled 2021-10-06: qty 1
  Filled 2021-10-06: qty 0.5
  Filled 2021-10-06: qty 1

## 2021-10-06 MED ORDER — PHENYLEPHRINE 80 MCG/ML (10ML) SYRINGE FOR IV PUSH (FOR BLOOD PRESSURE SUPPORT)
PREFILLED_SYRINGE | INTRAVENOUS | Status: DC | PRN
  Administered 2021-10-06: 80 ug via INTRAVENOUS

## 2021-10-06 MED ORDER — FENTANYL CITRATE (PF) 250 MCG/5ML IJ SOLN
INTRAMUSCULAR | Status: AC
  Filled 2021-10-06: qty 5

## 2021-10-06 MED ORDER — 0.9 % SODIUM CHLORIDE (POUR BTL) OPTIME
TOPICAL | Status: DC | PRN
  Administered 2021-10-06: 1000 mL

## 2021-10-06 MED ORDER — MIDAZOLAM HCL 2 MG/2ML IJ SOLN
INTRAMUSCULAR | Status: DC | PRN
  Administered 2021-10-06: 2 mg via INTRAVENOUS

## 2021-10-06 MED ORDER — FENTANYL CITRATE (PF) 250 MCG/5ML IJ SOLN
INTRAMUSCULAR | Status: DC | PRN
  Administered 2021-10-06 (×2): 50 ug via INTRAVENOUS
  Administered 2021-10-06 (×2): 25 ug via INTRAVENOUS

## 2021-10-06 MED ORDER — OXYCODONE HCL 5 MG PO TABS
5.0000 mg | ORAL_TABLET | Freq: Four times a day (QID) | ORAL | Status: DC | PRN
  Administered 2021-10-06: 5 mg via ORAL
  Filled 2021-10-06: qty 1

## 2021-10-06 MED ORDER — FENTANYL CITRATE (PF) 100 MCG/2ML IJ SOLN
INTRAMUSCULAR | Status: AC
  Filled 2021-10-06: qty 2

## 2021-10-06 MED ORDER — PROPOFOL 10 MG/ML IV BOLUS
INTRAVENOUS | Status: DC | PRN
  Administered 2021-10-06: 200 mg via INTRAVENOUS

## 2021-10-06 MED ORDER — ACETAMINOPHEN 160 MG/5ML PO SOLN: 325.0000 mg | ORAL | Status: DC | PRN

## 2021-10-06 MED ORDER — ACETAMINOPHEN 10 MG/ML IV SOLN
INTRAVENOUS | Status: AC
  Filled 2021-10-06: qty 100

## 2021-10-06 MED ORDER — LIDOCAINE 2% (20 MG/ML) 5 ML SYRINGE
INTRAMUSCULAR | Status: AC
  Filled 2021-10-06: qty 5

## 2021-10-06 MED ORDER — OXYCODONE HCL 5 MG/5ML PO SOLN: 5.0000 mg | Freq: Once | ORAL | Status: DC | PRN

## 2021-10-06 MED ORDER — ENOXAPARIN SODIUM 40 MG/0.4ML IJ SOSY
40.0000 mg | PREFILLED_SYRINGE | INTRAMUSCULAR | Status: DC
  Filled 2021-10-06: qty 0.4

## 2021-10-06 MED ORDER — ONDANSETRON HCL 4 MG/2ML IJ SOLN
INTRAMUSCULAR | Status: DC | PRN
  Administered 2021-10-06: 4 mg via INTRAVENOUS

## 2021-10-06 MED ORDER — FENTANYL CITRATE (PF) 100 MCG/2ML IJ SOLN
25.0000 ug | INTRAMUSCULAR | Status: DC | PRN
  Administered 2021-10-06 (×2): 50 ug via INTRAVENOUS

## 2021-10-06 MED ORDER — AMISULPRIDE (ANTIEMETIC) 5 MG/2ML IV SOLN: 10.0000 mg | Freq: Once | INTRAVENOUS | Status: DC | PRN

## 2021-10-06 MED ORDER — PROPOFOL 10 MG/ML IV BOLUS
INTRAVENOUS | Status: AC
Start: 2021-10-06 — End: ?
  Filled 2021-10-06: qty 20

## 2021-10-06 MED ORDER — MIDAZOLAM HCL 2 MG/2ML IJ SOLN
INTRAMUSCULAR | Status: AC
Start: 2021-10-06 — End: ?
  Filled 2021-10-06: qty 2

## 2021-10-06 MED ORDER — VANCOMYCIN HCL IN DEXTROSE 1-5 GM/200ML-% IV SOLN: 1000.0000 mg | INTRAVENOUS | Status: DC

## 2021-10-06 MED ORDER — OXYCODONE HCL 5 MG PO TABS
5.0000 mg | ORAL_TABLET | ORAL | Status: DC | PRN
  Administered 2021-10-06 – 2021-10-07 (×5): 5 mg via ORAL
  Filled 2021-10-06 (×6): qty 1

## 2021-10-06 MED ORDER — LACTATED RINGERS IV SOLN: INTRAVENOUS | Status: DC | PRN

## 2021-10-06 MED ORDER — ACETAMINOPHEN 10 MG/ML IV SOLN
1000.0000 mg | Freq: Once | INTRAVENOUS | Status: DC | PRN
  Administered 2021-10-06: 1000 mg via INTRAVENOUS

## 2021-10-06 MED ORDER — ACETAMINOPHEN 325 MG PO TABS: 325.0000 mg | ORAL_TABLET | ORAL | Status: DC | PRN

## 2021-10-06 MED ORDER — MORPHINE SULFATE (PF) 4 MG/ML IV SOLN
4.0000 mg | Freq: Once | INTRAVENOUS | Status: AC
  Administered 2021-10-06: 4 mg via INTRAVENOUS
  Filled 2021-10-06: qty 1

## 2021-10-06 MED ORDER — NICOTINE 21 MG/24HR TD PT24
21.0000 mg | MEDICATED_PATCH | Freq: Every day | TRANSDERMAL | Status: DC
  Administered 2021-10-06 – 2021-10-07 (×2): 21 mg via TRANSDERMAL
  Filled 2021-10-06 (×2): qty 1

## 2021-10-06 MED ORDER — DEXAMETHASONE SODIUM PHOSPHATE 10 MG/ML IJ SOLN
INTRAMUSCULAR | Status: AC
  Filled 2021-10-06: qty 1

## 2021-10-06 MED ORDER — ORAL CARE MOUTH RINSE: 15.0000 mL | Freq: Once | OROMUCOSAL | Status: AC

## 2021-10-06 MED ORDER — POLYETHYLENE GLYCOL 3350 17 G PO PACK: 17.0000 g | PACK | Freq: Every day | ORAL | Status: DC | PRN

## 2021-10-06 MED ORDER — ACETAMINOPHEN 325 MG PO TABS
650.0000 mg | ORAL_TABLET | Freq: Four times a day (QID) | ORAL | Status: DC | PRN
  Administered 2021-10-06 – 2021-10-07 (×3): 650 mg via ORAL
  Filled 2021-10-06 (×3): qty 2

## 2021-10-06 MED ORDER — OXYCODONE HCL 5 MG PO TABS: 5.0000 mg | ORAL_TABLET | Freq: Once | ORAL | Status: DC | PRN

## 2021-10-06 MED ORDER — BUPIVACAINE HCL (PF) 0.25 % IJ SOLN
INTRAMUSCULAR | Status: AC
Start: 2021-10-06 — End: ?
  Filled 2021-10-06: qty 30

## 2021-10-06 MED ORDER — ONDANSETRON HCL 4 MG/2ML IJ SOLN
INTRAMUSCULAR | Status: AC
Start: 2021-10-06 — End: ?
  Filled 2021-10-06: qty 2

## 2021-10-06 MED ORDER — LACTATED RINGERS IV SOLN: INTRAVENOUS | Status: DC

## 2021-10-06 MED ORDER — VANCOMYCIN HCL IN DEXTROSE 1-5 GM/200ML-% IV SOLN
1000.0000 mg | Freq: Once | INTRAVENOUS | Status: AC
  Administered 2021-10-06: 1000 mg via INTRAVENOUS
  Filled 2021-10-06: qty 200

## 2021-10-06 MED ORDER — LIDOCAINE 2% (20 MG/ML) 5 ML SYRINGE
INTRAMUSCULAR | Status: DC | PRN
  Administered 2021-10-06: 60 mg via INTRAVENOUS

## 2021-10-06 MED ORDER — SODIUM CHLORIDE 0.9 % IR SOLN
Status: DC | PRN
  Administered 2021-10-06: 3000 mL

## 2021-10-06 MED ORDER — VANCOMYCIN HCL IN DEXTROSE 1-5 GM/200ML-% IV SOLN
1000.0000 mg | Freq: Two times a day (BID) | INTRAVENOUS | Status: DC
  Administered 2021-10-06 – 2021-10-07 (×3): 1000 mg via INTRAVENOUS
  Filled 2021-10-06 (×4): qty 200

## 2021-10-06 MED ORDER — LACTATED RINGERS IV SOLN: INTRAVENOUS | Status: AC

## 2021-10-06 SURGICAL SUPPLY — 66 items
BAG COUNTER SPONGE SURGICOUNT (BAG) ×2 IMPLANT
BNDG CONFORM 2 STRL LF (GAUZE/BANDAGES/DRESSINGS) IMPLANT
BNDG ELASTIC 2X5.8 VLCR STR LF (GAUZE/BANDAGES/DRESSINGS) ×2 IMPLANT
BNDG ELASTIC 3X5.8 VLCR STR LF (GAUZE/BANDAGES/DRESSINGS) ×2 IMPLANT
BNDG ELASTIC 4X5.8 VLCR STR LF (GAUZE/BANDAGES/DRESSINGS) ×2 IMPLANT
BNDG ESMARK 4X9 LF (GAUZE/BANDAGES/DRESSINGS) IMPLANT
BNDG GAUZE DERMACEA FLUFF (GAUZE/BANDAGES/DRESSINGS) ×1
BNDG GAUZE DERMACEA FLUFF 4 (GAUZE/BANDAGES/DRESSINGS) IMPLANT
BNDG GAUZE ELAST 4 BULKY (GAUZE/BANDAGES/DRESSINGS) ×4 IMPLANT
CHLORAPREP W/TINT 26 (MISCELLANEOUS) ×1 IMPLANT
CORD BIPOLAR FORCEPS 12FT (ELECTRODE) ×2 IMPLANT
COVER BACK TABLE 60X90IN (DRAPES) IMPLANT
COVER MAYO STAND STRL (DRAPES) ×2 IMPLANT
COVER SURGICAL LIGHT HANDLE (MISCELLANEOUS) ×2 IMPLANT
CUFF TOURN SGL QUICK 18X4 (TOURNIQUET CUFF) ×2 IMPLANT
CUFF TOURN SGL QUICK 24 (TOURNIQUET CUFF)
CUFF TRNQT CYL 24X4X16.5-23 (TOURNIQUET CUFF) IMPLANT
DRAIN PENROSE 18X1/4 LTX STRL (DRAIN) ×1 IMPLANT
DRAPE HALF SHEET 40X57 (DRAPES) ×2 IMPLANT
DRAPE OEC MINIVIEW 54X84 (DRAPES) IMPLANT
DRAPE SURG 17X23 STRL (DRAPES) ×2 IMPLANT
DRSG ADAPTIC 3X8 NADH LF (GAUZE/BANDAGES/DRESSINGS) ×2 IMPLANT
DRSG XEROFORM 1X8 (GAUZE/BANDAGES/DRESSINGS) ×1 IMPLANT
GAUZE SPONGE 4X4 12PLY STRL (GAUZE/BANDAGES/DRESSINGS) ×2 IMPLANT
GAUZE XEROFORM 1X8 LF (GAUZE/BANDAGES/DRESSINGS) ×2 IMPLANT
GLOVE BIO SURGEON STRL SZ7 (GLOVE) ×2 IMPLANT
GLOVE BIOGEL M 7.0 STRL (GLOVE) ×1 IMPLANT
GLOVE BIOGEL PI IND STRL 7.0 (GLOVE) IMPLANT
GLOVE BIOGEL PI INDICATOR 7.0 (GLOVE) ×1
GLOVE SURG UNDER POLY LF SZ7 (GLOVE) ×2 IMPLANT
GOWN STRL REUS W/ TWL XL LVL3 (GOWN DISPOSABLE) ×2 IMPLANT
GOWN STRL REUS W/TWL XL LVL3 (GOWN DISPOSABLE) ×2
IV CATH 18G X1.75 CATHLON (IV SOLUTION) IMPLANT
KIT BASIN OR (CUSTOM PROCEDURE TRAY) ×2 IMPLANT
KIT TURNOVER KIT B (KITS) ×2 IMPLANT
LOOP VESSEL MAXI BLUE (MISCELLANEOUS) IMPLANT
MANIFOLD NEPTUNE II (INSTRUMENTS) ×1 IMPLANT
NDL HYPO 25GX1X1/2 BEV (NEEDLE) IMPLANT
NDL HYPO 25X1 1.5 SAFETY (NEEDLE) ×1 IMPLANT
NDL KEITH (NEEDLE) IMPLANT
NEEDLE HYPO 25GX1X1/2 BEV (NEEDLE) IMPLANT
NEEDLE HYPO 25X1 1.5 SAFETY (NEEDLE) ×2 IMPLANT
NEEDLE KEITH (NEEDLE) IMPLANT
NS IRRIG 1000ML POUR BTL (IV SOLUTION) ×2 IMPLANT
PACK ORTHO EXTREMITY (CUSTOM PROCEDURE TRAY) ×2 IMPLANT
PAD ABD 8X10 STRL (GAUZE/BANDAGES/DRESSINGS) ×2 IMPLANT
PAD ARMBOARD 7.5X6 YLW CONV (MISCELLANEOUS) ×2 IMPLANT
PAD CAST 4YDX4 CTTN HI CHSV (CAST SUPPLIES) ×2 IMPLANT
PADDING CAST ABS 3INX4YD NS (CAST SUPPLIES) ×1
PADDING CAST ABS COTTON 3X4 (CAST SUPPLIES) ×1 IMPLANT
PADDING CAST COTTON 4X4 STRL (CAST SUPPLIES) ×2
SPLINT PLASTER CAST XFAST 3X15 (CAST SUPPLIES) ×1 IMPLANT
SPLINT PLASTER XTRA FASTSET 3X (CAST SUPPLIES) ×1
SPONGE T-LAP 4X18 ~~LOC~~+RFID (SPONGE) ×2 IMPLANT
SUT ETHILON 3 0 PS 1 (SUTURE) ×1 IMPLANT
SUT FIBERWIRE 3-0 18 TAPR NDL (SUTURE)
SUTURE FIBERWR 3-0 18 TAPR NDL (SUTURE) IMPLANT
SWAB CULTURE ESWAB REG 1ML (MISCELLANEOUS) ×2 IMPLANT
SYR BULB EAR ULCER 3OZ GRN STR (SYRINGE) ×2 IMPLANT
SYR CONTROL 10ML LL (SYRINGE) IMPLANT
TOWEL GREEN STERILE (TOWEL DISPOSABLE) ×2 IMPLANT
TOWEL GREEN STERILE FF (TOWEL DISPOSABLE) ×4 IMPLANT
TUBE CONNECTING 12X1/4 (SUCTIONS) IMPLANT
UNDERPAD 30X36 HEAVY ABSORB (UNDERPADS AND DIAPERS) ×2 IMPLANT
WATER STERILE IRR 1000ML POUR (IV SOLUTION) ×2 IMPLANT
YANKAUER SUCT BULB TIP NO VENT (SUCTIONS) IMPLANT

## 2021-10-06 NOTE — Interval H&P Note (Signed)
History and Physical Interval Note:  10/06/2021 5:04 PM  Nicholas Fischer  has presented today for surgery, with the diagnosis of LEFT HAND WOUND INFECTION.  The various methods of treatment have been discussed with the patient and family. After consideration of risks, benefits and other options for treatment, the patient has consented to  Procedure(s): IRRIGATION AND DEBRIDEMENT HAND (Left) as a surgical intervention.  The patient's history has been reviewed, patient examined, no change in status, stable for surgery.  I have reviewed the patient's chart and labs.  Questions were answered to the patient's satisfaction.     [name]

## 2021-10-06 NOTE — ED Notes (Signed)
Report called to short stay RN. Will transport patient upstairs at this time.

## 2021-10-06 NOTE — Anesthesia Preprocedure Evaluation (Signed)
Anesthesia Evaluation  Patient identified by MRN, date of birth, ID band Patient awake    Reviewed: Allergy & Precautions, NPO status , Patient's Chart, lab work & pertinent test results  Airway Mallampati: II  TM Distance: >3 FB Neck ROM: Full    Dental  (+) Poor Dentition, Chipped, Missing   Pulmonary Current Smoker,    breath sounds clear to auscultation       Cardiovascular negative cardio ROS   Rhythm:Regular Rate:Normal     Neuro/Psych negative neurological ROS     GI/Hepatic negative GI ROS, (+) Hepatitis -, C  Endo/Other  negative endocrine ROS  Renal/GU negative Renal ROS     Musculoskeletal negative musculoskeletal ROS (+)   Abdominal Normal abdominal exam  (+)   Peds  Hematology   Anesthesia Other Findings   Reproductive/Obstetrics                             Anesthesia Physical Anesthesia Plan  ASA: 2  Anesthesia Plan: General   Post-op Pain Management:    Induction: Intravenous  PONV Risk Score and Plan: 2 and Ondansetron, Dexamethasone and Midazolam  Airway Management Planned: LMA  Additional Equipment: None  Intra-op Plan:   Post-operative Plan: Extubation in OR  Informed Consent: I have reviewed the patients History and Physical, chart, labs and discussed the procedure including the risks, benefits and alternatives for the proposed anesthesia with the patient or authorized representative who has indicated his/her understanding and acceptance.     Dental advisory given  Plan Discussed with: CRNA  Anesthesia Plan Comments:         Anesthesia Quick Evaluation

## 2021-10-06 NOTE — Progress Notes (Signed)
PROGRESS NOTE    Nicholas Fischer  PZW:[id number] DOB: 06/24/1965 DOA: 10/05/2021 PCP: Patient, No Pcp Per     Chief Complaint  Patient presents with   Hand Pain    Brief Narrative:   This is a no charge note as patient was seen and admitted earlier today by Dr. Margo Aye, patient was seen and examined, chart, imaging and labs were reviewed.     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 Frazier Butt who presented to [facility] ED from home with left hand swelling at base of thumb and severe pain, referred to the ED by his orthopedic surgeon.  Associated with purulent drainage.     Work-up in the ED reveals left hand postop wound infection.  The patient was started on IV antibiotics empirically.  EDP discussed the case with orthopedic surgery, Dr. Magnus Ivan, plan for washout in the OR this morning.  The patient was seen and examined at bedside.  His left hand pain is well controlled on current pain management.  Blood cultures x2 peripherally obtained.  The patient was admitted by the hospitalist service at the request of orthopedic surgery.   ED Course: Tmax 98.7.  BP 144/81, pulse 73, respiratory 16, saturation 92% on room air.  Lab studies remarkable for WBC 15.3.  Neutrophil count 11.5.   Assessment & Plan:   Principal Problem:   Wound infection   Left hand postop wound infection, POA.  Leukocytosis, WBC 15.3K Follow blood cultures Obtain MRSA screening test Continue IV vancomycin started in the ED Pain management along with bowel regimen Plan for washout in the OR on 10/06/2021 N.p.o. in anticipation for washout   Mild hyperglycemia in the setting of infection Serum glucose 120 Monitor for now  Tobacco abuse -Started on nicotine patch   DVT prophylaxis: Lovenox Code  Status: Full Family Communication: none at bedside Disposition:   Status is: Inpatient Remains inpatient appropriate because: IV antibiotics in need of surgery   Consultants:  Orthopedic Subjective: He complains of left hand pain  Objective: Vitals:   10/06/21 0600 10/06/21 0815 10/06/21 0840 10/06/21 1224  BP: 128/79 139/86 (!) 141/84 133/85  Pulse: 69 74 66 60  Resp: 14 18 15 15   Temp:   98.7 F (37.1 C) 97.9 F (36.6 C)  TempSrc:   Oral Oral  SpO2: 93% 93% 96% 96%   No intake or output data in the 24 hours ending 10/06/21 1246 There were no vitals filed for this visit.  Examination:  Awake Alert, Oriented X 3, No new F.N deficits, Normal affect Symmetrical Chest wall movement, Good air movement bilaterally, CTAB RRR,No Gallops,Rubs or new Murmurs, Left hand bandaged    Data Reviewed: I have personally reviewed following labs and imaging studies  CBC: Recent Labs  Lab 10/05/21 1933 10/06/21 0642  WBC 15.3* 12.5*  NEUTROABS 11.5* 8.8*  HGB 15.9 15.9  HCT 47.6 47.0  MCV 89.3 89.0  PLT 381 365    Basic Metabolic Panel: Recent Labs  Lab 10/05/21 1933 10/06/21 0642  NA 135 137  K 3.7 4.0  CL 103 106  CO2 22 23  GLUCOSE 120* 109*  BUN 10 10  CREATININE 1.07 0.96  CALCIUM 9.0 9.0  MG  --  1.9  PHOS  --  3.1    GFR: Estimated Creatinine Clearance: 111.1 mL/min (  by C-G formula based on SCr of 0.96 mg/dL).  Liver Function Tests: Recent Labs  Lab 10/05/21 1933 10/06/21 0642  AST 20 19  ALT 28 25  ALKPHOS 111 109  BILITOT 0.4 0.8  PROT 6.9 6.6  ALBUMIN 3.2* 3.0*    CBG: No results for input(s): "GLUCAP" in the last 168 hours.   No results found for this or any previous visit (from the past 240 hour(s)).       Radiology Studies: DG Hand Complete Left  Result Date: 10/06/2021 CLINICAL DATA:  56 year old male with infection. EXAM: LEFT HAND - COMPLETE 3+ VIEW COMPARISON:  Left thumb series 08/15/2021. FINDINGS: Generalized soft  tissue swelling thenar eminence. No soft tissue gas identified. No radiopaque foreign body identified. But there is cortical osteolysis along the lateral base of the thumb metacarpal (image 2). Other carpal and metacarpal bones appear intact. Phalanges, distal radius and ulna appear intact. IMPRESSION: Positive for Left thenar soft tissue swelling and erosion of the base of the 1st metacarpal compatible with Active Osteomyelitis. Electronically Signed   By: Odessa Fleming M.D.   On: 10/06/2021 06:00        Scheduled Meds:  nicotine  21 mg Transdermal Daily   Continuous Infusions:  lactated ringers 50 mL/hr at 10/06/21 0646   vancomycin       LOS: 0 days       Huey Bienenstock, MD [facility]   To contact the attending provider between 7A-7P or the covering provider during after hours 7P-7A, please log into the web site [email or web address] and access using universal [place] password for that web site. If you do not have the password, please call the hospital operator.  10/06/2021, 12:46 PM

## 2021-10-06 NOTE — H&P (View-Only) (Signed)
HAND SURGERY CONSULTATION  REQUESTING PHYSICIAN: Elgergawy, Leana Roe, MD  Chief Complaint: Left thumb pain  HPI: This is a 56 year old gentleman who presents with a postop infection of the left thumb.  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  He was treated with buried pins.  The patient actually texted me approximately 1 week ago with swelling and concern for postop infection.  I encouraged him to come to my office or to the emergency room to be evaluated.  Since that time he has been working to get a ride as he does not drive.  He was unable to obtain a ride to the hospital until yesterday evening.  I previously encouraged the patient to call EMS for a ride but he was unable to do so.  He describes pain at the dorsal aspect of the thumb around the incision.  The pins fell out at his home several days ago.  He denies any systemic symptoms.   Hand dominance: LHD Occupation: Unemployed currently, previously Holiday representative  Past Medical History:  Diagnosis Date   Hepatitis    Hep C- went away   Umbilical hernia    Past Surgical History:  Procedure Laterality Date   APPENDECTOMY     BACK SURGERY     CLOSED REDUCTION FINGER WITH PERCUTANEOUS PINNING Left 09/22/2021   Procedure: OPEN REDUCTION AND PERCUTANEOUS PINNING OF LEFT THUMB CARPAL METACARPAL DISLOCATION;  Surgeon: Marlyne Beards, MD;  Location: [facility] OR;  Service: Orthopedics;  Laterality: Left;   COLONOSCOPY W/ POLYPECTOMY     FACIAL RECONSTRUCTION SURGERY     1990   KNEE SURGERY Right    WRIST SURGERY Right    cyst removed   Social History   Socioeconomic History   Marital status: Married    Spouse name: Not on file   Number of children: Not on file   Years of education: Not on file   Highest education level: Not on file  Occupational History   Not on file  Tobacco Use   Smoking status: Every Day    Packs/day: 1.00    Years: 40.00    Total pack  years: 40.00    Types: Cigarettes   Smokeless tobacco: Never  Vaping Use   Vaping Use: Never used  Substance and Sexual Activity   Alcohol use: Yes    Comment: 1 time a month either wine, liquor,beer   Drug use: Never   Sexual activity: Yes  Other Topics Concern   Not on file  Social History Narrative   Not on file   Social Determinants of Health   Financial Resource Strain: Not on file  Food Insecurity: Not on file  Transportation Needs: Not on file  Physical Activity: Not on file  Stress: Not on file  Social Connections: Not on file   No family history on file. - negative except otherwise stated in the family history section No Known Allergies Prior to Admission medications   Medication Sig Start Date End Date Taking? Authorizing Provider  oxyCODONE (ROXICODONE) 5 MG immediate release tablet Take 1 tablet (5 mg total) by mouth every 6 (six) hours as needed for up to 5 days for severe pain. 10/03/21 10/08/21 Yes Marlyne Beards, MD  meloxicam (MOBIC) 15 MG tablet Take 1 tablet (15 mg total) by mouth daily. Patient not taking: Reported on 09/19/2021 08/15/21   Rodolph Bong, MD   DG Hand Complete Left  Result Date: 10/06/2021  CLINICAL DATA:  56 year old male with infection. EXAM: LEFT HAND - COMPLETE 3+ VIEW COMPARISON:  Left thumb series 08/15/2021. FINDINGS: Generalized soft tissue swelling thenar eminence. No soft tissue gas identified. No radiopaque foreign body identified. But there is cortical osteolysis along the lateral base of the thumb metacarpal (image 2). Other carpal and metacarpal bones appear intact. Phalanges, distal radius and ulna appear intact. IMPRESSION: Positive for Left thenar soft tissue swelling and erosion of the base of the 1st metacarpal compatible with Active Osteomyelitis. Electronically Signed   By: Odessa Fleming M.D.   On: 10/06/2021 06:00   - pertinent xrays, CT, MRI studies were reviewed and independently interpreted  Positive ROS: All other systems have  been reviewed and were otherwise negative with the exception of those mentioned in the HPI and as above.  Physical Exam: General: No acute distress, resting comfortably Cardiovascular: BUE warm and well perfused Respiratory: Normal WOB on RA Skin: Erythema around surgical incision Neurologic: Sensation intact distally Psychiatric: Patient is at baseline mood and affect  Left Hand; Swelling and erythema around surgical site Purulent drainage from small open areas No pain w/ flexion/extension at IP joint No TTP along flexor tendon sheath    Assessment: 56 yo LHD M w/ postop infection after open reduction and percutaneous pinning of left thumb CMC dislocation  Plan: OR today for irrigation and debridement We reviewed the risk of surgery including bleeding, infection, damage to neurovascular structures, incomplete symptom relief, need for additional procedures We will leave wound open with Penrose drains We will start twice daily wound care with soaks in warm, diluted Hibiclens solution We will take intraoperative cultures Currently on IV antibiotics Admitted to the hospitalist service   Thank you for the consult and the opportunity to see Mr. Ermalene Searing, M.D. [facility] 8:29 AM

## 2021-10-06 NOTE — ED Notes (Signed)
This EMT re-wrapped pt hand per MD.

## 2021-10-06 NOTE — Anesthesia Postprocedure Evaluation (Signed)
Anesthesia Post Note  Patient: Nicholas Fischer  Procedure(s) Performed: IRRIGATION AND DEBRIDEMENT HAND (Left)     Patient location during evaluation: PACU Anesthesia Type: General Level of consciousness: awake and alert Pain management: pain level controlled Vital Signs Assessment: post-procedure vital signs reviewed and stable Respiratory status: spontaneous breathing, nonlabored ventilation, respiratory function stable and patient connected to nasal cannula oxygen Cardiovascular status: blood pressure returned to baseline and stable Postop Assessment: no apparent nausea or vomiting Anesthetic complications: no   No notable events documented.  Last Vitals:  Vitals:   10/06/21 2030 10/06/21 2045  BP: (!) 165/96 (!) 171/99  Pulse: 65 71  Resp: 17 13  Temp:    SpO2: 97% 92%    Last Pain:  Vitals:   10/06/21 2045  TempSrc:   PainSc: Asleep                 Shelton Silvas

## 2021-10-06 NOTE — ED Provider Notes (Signed)
[facility] EMERGENCY DEPARTMENT Provider Note   CSN: [id number] Arrival date & time: 10/05/21  1846     History  Chief Complaint  Patient presents with   Hand Pain    Nicholas HOPFENSPERGER is a 56 y.o. male.  The history is provided by the patient.  Hand Pain  He states that he had an operation on his left hand that it is infected and draining pus.  He states that 2 pins were placed and they have come out.  He denies fever or chills or sweats.  He is complaining of pain and has been taking hydrocodone-acetaminophen without relief.  He states that it has been draining for several days.   Home Medications Prior to Admission medications   Medication Sig Start Date End Date Taking? Authorizing Provider  meloxicam (MOBIC) 15 MG tablet Take 1 tablet (15 mg total) by mouth daily. Patient not taking: Reported on 09/19/2021 08/15/21   Rodolph Bong, MD  oxyCODONE (ROXICODONE) 5 MG immediate release tablet Take 1 tablet (5 mg total) by mouth every 6 (six) hours as needed for up to 5 days for severe pain. 10/03/21 10/08/21  Marlyne Beards, MD      Allergies    Patient has no known allergies.    Review of Systems   Review of Systems  All other systems reviewed and are negative.   Physical Exam Updated Vital Signs BP (!) 144/82 (BP Location: Right Arm)   Pulse 80   Temp 98.7 F (37.1 C) (Oral)   Resp 18   SpO2 97%  Physical Exam Vitals and nursing note reviewed.   56 year old male, resting comfortably and in no acute distress. Vital signs are significant for borderline elevated blood pressure. Oxygen saturation is 97%, which is normal. Head is normocephalic and atraumatic. PERRLA, EOMI. Oropharynx is clear. Neck is nontender and supple without adenopathy. Lungs are clear without rales, wheezes, or rhonchi. Chest is nontender. Heart has regular rate and rhythm without murmur. Abdomen is soft, flat, nontender. Extremities: Surgical incision is present on the dorsum  of the left hand overlying the first metacarpal.  Sutures are noted to be in place.  This area is moderately swollen and markedly erythematous.  There are 2 punctures through the skin through which there is some slight purulent drainage.  No lymphangitic streaks are seen.  Remainder of extremity exam is unremarkable. Skin is warm and dry without rash. Neurologic: Mental status is normal, cranial nerves are intact, moves all extremities equally.  ED Results / Procedures / Treatments   Labs (all labs ordered are listed, but only abnormal results are displayed) Labs Reviewed  COMPREHENSIVE METABOLIC PANEL - Abnormal; Notable for the following components:      Result Value   Glucose, Bld 120 (*)    Albumin 3.2 (*)    All other components within normal limits  CBC WITH DIFFERENTIAL/PLATELET - Abnormal; Notable for the following components:   WBC 15.3 (*)    Neutro Abs 11.5 (*)    Monocytes Absolute 1.2 (*)    Abs Immature Granulocytes 0.12 (*)    All other components within normal limits   Radiology No results found.  Procedures Procedures    Medications Ordered in ED Medications  morphine (PF) 4 MG/ML injection 4 mg (has no administration in time range)  vancomycin (VANCOCIN) IVPB 1000 mg/200 mL premix (has no administration in time range)    ED Course/ Medical Decision Making/ A&P  Medical Decision Making Amount and/or Complexity of Data Reviewed Radiology: ordered.  Risk Prescription drug management. Decision regarding hospitalization.   Postoperative wound infection the left hand.  Old records are reviewed, and he is noted to have had open reduction and internal fixation of dislocation of the left first carpometacarpal joint on 09/22/2021.  Office note from 10/02/2021 mentions infection with one of the pins having fallen out and that he was not able to get to the office or to the emergency department because of a ride.  Another note yesterday details  the same problem with transportation.  I have reviewed and interpreted laboratory testing which was done on arrival and my interpretation is that he has a moderate leukocytosis consistent with infection, also mildly elevated glucose consistent with prediabetes.  I have ordered x-rays of the hand, Case has been discussed with Dr. Magnus Ivan who is on-call for Dr. Frazier Butt who request the patient be admitted to the hospitalist with plan for surgical washout later today.  Case is discussed with Dr. Margo Aye of [facility], agrees to admit the patient.  I have independently viewed the hand x-ray that has been obtained and I see no evidence of any residual hardware, but no clear evidence of osteomyelitis.  This is subject to final interpretation by radiologist.  Final Clinical Impression(s) / ED Diagnoses Final diagnoses:  Wound infection after surgery    Rx / DC Orders ED Discharge Orders     None         Dione Booze, MD 10/06/21 [id number]

## 2021-10-06 NOTE — Brief Op Note (Addendum)
10/06/2021  8:01 PM  PATIENT:  Nicholas Fischer  56 y.o. male  PRE-OPERATIVE DIAGNOSIS:  LEFT HAND WOUND INFECTION  POST-OPERATIVE DIAGNOSIS:  LEFT HAND WOUND INFECTION  PROCEDURE:  Procedure(s): IRRIGATION AND DEBRIDEMENT HAND (Left)  SURGEON:  Surgeon(s) and Role:    * Marlyne Beards, MD - Primary  PHYSICIAN ASSISTANT:   ASSISTANTS: none   ANESTHESIA:   general  EBL:  10   BLOOD ADMINISTERED:none  DRAINS: Penrose drain in the left hand x 3    LOCAL MEDICATIONS USED:  NONE  SPECIMEN:  Source of Specimen:  Left hand culture swab  DISPOSITION OF SPECIMEN:   Micro  COUNTS:  YES  TOURNIQUET:   Total Tourniquet Time Documented: Upper Arm (Left) - 26 minutes Total: Upper Arm (Left) - 26 minutes   DICTATION: .Reubin Milan Dictation  PLAN OF CARE: Admitted  PATIENT DISPOSITION:  PACU - hemodynamically stable.   Delay start of Pharmacological VTE agent (>24hrs) due to surgical blood loss or risk of bleeding: not applicable   Penrose drains x 3, will likely pull tomorrow Follow up cultures Continue IV abx Recommend ID consult, possible osteomyelitis w/ some dorsal metacarpal bony destruction Will start wound care once drains come out

## 2021-10-06 NOTE — H&P (Signed)
History and Physical  Nicholas Fischer NGE:[id number] DOB: 11/14/1965 DOA: 10/05/2021  Referring physician: Dr. Preston Fleeting, EDP  PCP: Patient, No Pcp Per  Outpatient Specialists: Orthopedic surgery Patient coming from: Home  Chief Complaint: Left hand pain and swelling.  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 Frazier Butt who presented to [facility] ED from home with left hand swelling at base of thumb and severe pain, referred to the ED by his orthopedic surgeon.  Associated with purulent drainage.    Work-up in the ED reveals left hand postop wound infection.  The patient was started on IV antibiotics empirically.  EDP discussed the case with orthopedic surgery, Dr. Magnus Ivan, plan for washout in the OR this morning.  The patient was seen and examined at bedside.  His left hand pain is well controlled on current pain management.  Blood cultures x2 peripherally obtained.  The patient was admitted by the hospitalist service at the request of orthopedic surgery.  ED Course: Tmax 98.7.  BP 144/81, pulse 73, respiratory 16, saturation 92% on room air.  Lab studies remarkable for WBC 15.3.  Neutrophil count 11.5.  Review of Systems: Review of systems as noted in the HPI. All other systems reviewed and are negative.   Past Medical History:  Diagnosis Date   Hepatitis    Hep C- went away   Umbilical hernia    Past Surgical History:  Procedure Laterality Date   APPENDECTOMY     BACK SURGERY     CLOSED REDUCTION FINGER WITH PERCUTANEOUS PINNING Left 09/22/2021   Procedure: OPEN REDUCTION AND PERCUTANEOUS PINNING OF LEFT THUMB CARPAL METACARPAL DISLOCATION;  Surgeon: Marlyne Beards, MD;  Location: [facility] OR;  Service: Orthopedics;  Laterality: Left;   COLONOSCOPY W/ POLYPECTOMY     FACIAL  RECONSTRUCTION SURGERY     1990   KNEE SURGERY Right    WRIST SURGERY Right    cyst removed    Social History:  reports that he has been smoking cigarettes. He has a 40.00 pack-year smoking history. He has never used smokeless tobacco. He reports current alcohol use. He reports that he does not use drugs.   No Known Allergies  Family history: None reported.  Prior to Admission medications   Medication Sig Start Date End Date Taking? Authorizing Provider  meloxicam (MOBIC) 15 MG tablet Take 1 tablet (15 mg total) by mouth daily. Patient not taking: Reported on 09/19/2021 08/15/21   Rodolph Bong, MD  oxyCODONE (ROXICODONE) 5 MG immediate release tablet Take 1 tablet (5 mg total) by mouth every 6 (six) hours as needed for up to 5 days for severe pain. 10/03/21 10/08/21  Marlyne Beards, MD    Physical Exam: BP (!) 144/81   Pulse 73   Temp 98.7 F (37.1 C) (Oral)   Resp 16   SpO2 92%   General: 56 y.o. year-old male well developed well nourished in no acute distress.  Alert and oriented x3. Cardiovascular: Regular rate and rhythm with no rubs or gallops.  No thyromegaly or JVD noted.  No lower extremity edema. 2/4 pulses in all 4 extremities. Respiratory: Clear to auscultation with no wheezes or rales. Good inspiratory effort. Abdomen: Soft nontender nondistended with normal bowel sounds x4 quadrants. Muskuloskeletal: No cyanosis, clubbing or edema noted bilaterally Neuro: CN II-XII intact, strength, sensation, reflexes Skin:  Psychiatry: Judgement and insight appear normal. Mood is appropriate for condition and setting          Labs on Admission:  Basic Metabolic Panel: Recent Labs  Lab 10/05/21 1933  NA 135  K 3.7  CL 103  CO2 22  GLUCOSE 120*  BUN 10  CREATININE 1.07  CALCIUM 9.0   Liver Function Tests: Recent Labs  Lab 10/05/21 1933  AST 20  ALT 28  ALKPHOS 111  BILITOT 0.4  PROT 6.9  ALBUMIN 3.2*   No results for input(s): "LIPASE", "AMYLASE" in the  last 168 hours. No results for input(s): "AMMONIA" in the last 168 hours. CBC: Recent Labs  Lab 10/05/21 1933  WBC 15.3*  NEUTROABS 11.5*  HGB 15.9  HCT 47.6  MCV 89.3  PLT 381   Cardiac Enzymes: No results for input(s): "CKTOTAL", "CKMB", "CKMBINDEX", "TROPONINI" in the last 168 hours.  BNP (last 3 results) No results for input(s): "BNP" in the last 8760 hours.  ProBNP (last 3 results) No results for input(s): "PROBNP" in the last 8760 hours.  CBG: No results for input(s): "GLUCAP" in the last 168 hours.  Radiological Exams on Admission: No results found.  EKG: I independently viewed the EKG done and my findings are as followed: None available at the time of this visit.  Assessment/Plan Present on Admission:  Wound infection  Principal Problem:   Wound infection  Left hand postop wound infection, POA.  Leukocytosis, WBC 15.3K Follow blood cultures Obtain MRSA screening test Continue IV vancomycin started in the ED Pain management along with bowel regimen Plan for washout in the OR on 10/06/2021 N.p.o. in anticipation for washout  Mild hyperglycemia in the setting of infection Serum glucose 120 Monitor for now   DVT prophylaxis: SCDs  Code Status: Full code  Family Communication: None at bedside  Disposition Plan: Admitted to telemetry surgical unit  Consults called: Orthopedic surgery consulted by EDP  Admission status: Inpatient status.   Status is: Inpatient The patient requires at least 2 midnights for further evaluation and treatment of present condition.   Darlin Drop MD [facility] Pager [phone]  If 7PM-7AM, please contact night-coverage [email or web address] Password [email or web address]  10/06/2021, 5:20 AM

## 2021-10-06 NOTE — ED Notes (Signed)
Patient changed into hospital gown and all belongings placed in patient's bag and sent with patient prior to leaving department.

## 2021-10-06 NOTE — Transfer of Care (Signed)
Immediate Anesthesia Transfer of Care Note  Patient: Nicholas Fischer  Procedure(s) Performed: IRRIGATION AND DEBRIDEMENT HAND (Left)  Patient Location: PACU  Anesthesia Type:General  Level of Consciousness: drowsy  Airway & Oxygen Therapy: Patient Spontanous Breathing and Patient connected to nasal cannula oxygen  Post-op Assessment: Report given to RN, Post -op Vital signs reviewed and stable and Patient moving all extremities  Post vital signs: Reviewed and stable  Last Vitals:  Vitals Value Taken Time  BP 166/96 10/06/21 1959  Temp    Pulse 75 10/06/21 2002  Resp 15 10/06/21 2002  SpO2 98 % 10/06/21 2002  Vitals shown include unvalidated device data.  Last Pain:  Vitals:   10/06/21 1630  TempSrc:   PainSc: 7          Complications: No notable events documented.

## 2021-10-06 NOTE — Consult Note (Signed)
 HAND SURGERY CONSULTATION  REQUESTING PHYSICIAN: Elgergawy, Dawood S, MD  Chief Complaint: Left thumb pain  HPI: This is a 55-year-old gentleman who presents with a postop infection of the left thumb.  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  He was treated with buried pins.  The patient actually texted me approximately 1 week ago with swelling and concern for postop infection.  I encouraged him to come to my office or to the emergency room to be evaluated.  Since that time he has been working to get a ride as he does not drive.  He was unable to obtain a ride to the hospital until yesterday evening.  I previously encouraged the patient to call EMS for a ride but he was unable to do so.  He describes pain at the dorsal aspect of the thumb around the incision.  The pins fell out at his home several days ago.  He denies any systemic symptoms.   Hand dominance: LHD Occupation: Unemployed currently, previously construction  Past Medical History:  Diagnosis Date   Hepatitis    Hep C- went away   Umbilical hernia    Past Surgical History:  Procedure Laterality Date   APPENDECTOMY     BACK SURGERY     CLOSED REDUCTION FINGER WITH PERCUTANEOUS PINNING Left 09/22/2021   Procedure: OPEN REDUCTION AND PERCUTANEOUS PINNING OF LEFT THUMB CARPAL METACARPAL DISLOCATION;  Surgeon: Vincie Linn, Alexiz Sustaita, MD;  Location: [facility] OR;  Service: Orthopedics;  Laterality: Left;   COLONOSCOPY W/ POLYPECTOMY     FACIAL RECONSTRUCTION SURGERY     1990   KNEE SURGERY Right    WRIST SURGERY Right    cyst removed   Social History   Socioeconomic History   Marital status: Married    Spouse name: Not on file   Number of children: Not on file   Years of education: Not on file   Highest education level: Not on file  Occupational History   Not on file  Tobacco Use   Smoking status: Every Day    Packs/day: 1.00    Years: 40.00    Total pack  years: 40.00    Types: Cigarettes   Smokeless tobacco: Never  Vaping Use   Vaping Use: Never used  Substance and Sexual Activity   Alcohol use: Yes    Comment: 1 time a month either wine, liquor,beer   Drug use: Never   Sexual activity: Yes  Other Topics Concern   Not on file  Social History Narrative   Not on file   Social Determinants of Health   Financial Resource Strain: Not on file  Food Insecurity: Not on file  Transportation Needs: Not on file  Physical Activity: Not on file  Stress: Not on file  Social Connections: Not on file   No family history on file. - negative except otherwise stated in the family history section No Known Allergies Prior to Admission medications   Medication Sig Start Date End Date Taking? Authorizing Provider  oxyCODONE (ROXICODONE) 5 MG immediate release tablet Take 1 tablet (5 mg total) by mouth every 6 (six) hours as needed for up to 5 days for severe pain. 10/03/21 10/08/21 Yes Lonzie Simmer, Kayden Amend, MD  meloxicam (MOBIC) 15 MG tablet Take 1 tablet (15 mg total) by mouth daily. Patient not taking: Reported on 09/19/2021 08/15/21   Corey, Evan S, MD   DG Hand Complete Left  Result Date: 10/06/2021   CLINICAL DATA:  56 year old male with infection. EXAM: LEFT HAND - COMPLETE 3+ VIEW COMPARISON:  Left thumb series 08/15/2021. FINDINGS: Generalized soft tissue swelling thenar eminence. No soft tissue gas identified. No radiopaque foreign body identified. But there is cortical osteolysis along the lateral base of the thumb metacarpal (image 2). Other carpal and metacarpal bones appear intact. Phalanges, distal radius and ulna appear intact. IMPRESSION: Positive for Left thenar soft tissue swelling and erosion of the base of the 1st metacarpal compatible with Active Osteomyelitis. Electronically Signed   By: Odessa Fleming M.D.   On: 10/06/2021 06:00   - pertinent xrays, CT, MRI studies were reviewed and independently interpreted  Positive ROS: All other systems have  been reviewed and were otherwise negative with the exception of those mentioned in the HPI and as above.  Physical Exam: General: No acute distress, resting comfortably Cardiovascular: BUE warm and well perfused Respiratory: Normal WOB on RA Skin: Erythema around surgical incision Neurologic: Sensation intact distally Psychiatric: Patient is at baseline mood and affect  Left Hand; Swelling and erythema around surgical site Purulent drainage from small open areas No pain w/ flexion/extension at IP joint No TTP along flexor tendon sheath    Assessment: 56 yo LHD M w/ postop infection after open reduction and percutaneous pinning of left thumb CMC dislocation  Plan: OR today for irrigation and debridement We reviewed the risk of surgery including bleeding, infection, damage to neurovascular structures, incomplete symptom relief, need for additional procedures We will leave wound open with Penrose drains We will start twice daily wound care with soaks in warm, diluted Hibiclens solution We will take intraoperative cultures Currently on IV antibiotics Admitted to the hospitalist service   Thank you for the consult and the opportunity to see Mr. Ermalene Searing, M.D. [facility] 8:29 AM

## 2021-10-06 NOTE — Progress Notes (Signed)
Pharmacy Antibiotic Note  Nicholas Fischer is a 56 y.o. male admitted on 10/05/2021 with  wound infection .  Pharmacy has been consulted for Vancomycin dosing. WBC elevated. Renal function ok. Likely OR for washout.   Plan: Vancomycin 1000 mg IV q12h >>>Estimated AUC: 499 Trend WBC, temp, renal function  F/U infectious work-up Drug levels as indicated  Temp (24hrs), Avg:98.7 F (37.1 C), Min:98.7 F (37.1 C), Max:98.7 F (37.1 C)  Recent Labs  Lab 10/05/21 1933  WBC 15.3*  CREATININE 1.07    Estimated Creatinine Clearance: 99.7 mL/min (by C-G formula based on SCr of 1.07 mg/dL).    No Known Allergies  Abran Duke, PharmD, BCPS Clinical Pharmacist Phone: [phone]

## 2021-10-06 NOTE — Anesthesia Procedure Notes (Signed)
Procedure Name: LMA Insertion Date/Time: 10/06/2021 7:13 PM  Performed by: Denia Mcvicar T, CRNAPre-anesthesia Checklist: Patient identified, Emergency Drugs available, Suction available and Patient being monitored Patient Re-evaluated:Patient Re-evaluated prior to induction Oxygen Delivery Method: Circle system utilized Preoxygenation: Pre-oxygenation with 100% oxygen Induction Type: IV induction Ventilation: Mask ventilation without difficulty LMA: LMA inserted LMA Size: 5.0 Number of attempts: 1 Placement Confirmation: positive ETCO2 and breath sounds checked- equal and bilateral Tube secured with: Tape Dental Injury: Teeth and Oropharynx as per pre-operative assessment

## 2021-10-07 ENCOUNTER — Other Ambulatory Visit: Payer: Self-pay

## 2021-10-07 ENCOUNTER — Encounter (HOSPITAL_COMMUNITY): Payer: Self-pay | Admitting: Orthopedic Surgery

## 2021-10-07 ENCOUNTER — Encounter: Payer: Self-pay | Admitting: Orthopedic Surgery

## 2021-10-07 DIAGNOSIS — M86142 Other acute osteomyelitis, left hand: Secondary | ICD-10-CM

## 2021-10-07 DIAGNOSIS — L089 Local infection of the skin and subcutaneous tissue, unspecified: Secondary | ICD-10-CM | POA: Diagnosis not present

## 2021-10-07 DIAGNOSIS — M869 Osteomyelitis, unspecified: Secondary | ICD-10-CM

## 2021-10-07 DIAGNOSIS — T148XXA Other injury of unspecified body region, initial encounter: Secondary | ICD-10-CM | POA: Diagnosis not present

## 2021-10-07 LAB — CBC
HCT: 47.2 % (ref 39.0–52.0)
Hemoglobin: 16.3 g/dL (ref 13.0–17.0)
MCH: 30.8 pg (ref 26.0–34.0)
MCHC: 34.5 g/dL (ref 30.0–36.0)
MCV: 89.2 fL (ref 80.0–100.0)
Platelets: 360 10*3/uL (ref 150–400)
RBC: 5.29 MIL/uL (ref 4.22–5.81)
RDW: 13.3 % (ref 11.5–15.5)
WBC: 11.4 10*3/uL — ABNORMAL HIGH (ref 4.0–10.5)
nRBC: 0 % (ref 0.0–0.2)

## 2021-10-07 LAB — BASIC METABOLIC PANEL
Anion gap: 8 (ref 5–15)
BUN: 10 mg/dL (ref 6–20)
CO2: 24 mmol/L (ref 22–32)
Calcium: 8.6 mg/dL — ABNORMAL LOW (ref 8.9–10.3)
Chloride: 104 mmol/L (ref 98–111)
Creatinine, Ser: 1.12 mg/dL (ref 0.61–1.24)
GFR, Estimated: >60 mL/min (ref >60)
Glucose, Bld: 136 mg/dL — ABNORMAL HIGH (ref 70–99)
Potassium: 3.6 mmol/L (ref 3.5–5.1)
Sodium: 136 mmol/L (ref 135–145)

## 2021-10-07 LAB — HIV ANTIBODY (ROUTINE TESTING W REFLEX): HIV Screen 4th Generation wRfx: NONREACTIVE

## 2021-10-07 MED ORDER — HYDROMORPHONE HCL 1 MG/ML IJ SOLN
1.0000 mg | INTRAMUSCULAR | Status: DC | PRN
  Administered 2021-10-07 (×5): 1 mg via INTRAVENOUS
  Filled 2021-10-07 (×5): qty 1

## 2021-10-07 MED ORDER — ORAL CARE MOUTH RINSE: 15.0000 mL | OROMUCOSAL | Status: DC | PRN

## 2021-10-07 MED ORDER — SODIUM CHLORIDE 0.9 % IV SOLN
2.0000 g | Freq: Three times a day (TID) | INTRAVENOUS | Status: DC
  Administered 2021-10-07: 2 g via INTRAVENOUS
  Filled 2021-10-07: qty 12.5

## 2021-10-07 NOTE — Progress Notes (Signed)
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 Frazier Butt who presented to [facility] ED from home with left hand swelling at base of thumb and severe pain, referred to the ED by his orthopedic surgeon.  Associated with purulent drainage.   Work-up in the ED reveals left hand postop wound infection.  The patient was started on IV antibiotics empirically.  He went to the OR for I&D of postoperative infection of left stump at [facility] joint, by Dr. Frazier Butt 6/19, intraoperative cultures showing gram-positive cocci.  Assessment & Plan:   Principal Problem:   Wound infection   Left hand postop wound infection, POA.  Leukocytosis, WBC 15.3K Follow blood cultures, remains with no growth to date Operative cultures showing gram-positive cocci, continue with IV vancomycin Pain management along with bowel regimen Status post I &D of postoperative infection of left stump at [facility] joint, by Dr. Frazier Butt 6/19, intraoperative cultures showing gram-positive cocci. We will consult ID given possible osteomyelitis with some dorsal metacarpal bony destruction   Mild hyperglycemia in the setting of infection Serum glucose 120 Monitor for now  Tobacco abuse -Started on nicotine patch   DVT prophylaxis: Lovenox Code Status: Full Family Communication: none at bedside Disposition:   Status is: Inpatient Remains inpatient appropriate because: IV antibiotics in need of surgery   Consultants:  Orthopedic Infectious disease Subjective: Reports left head pain has improved  Objective: Vitals:   10/06/21 2109 10/06/21  2308 10/07/21 0412 10/07/21 0758  BP: (!) 169/77 (!) 168/72 137/74 140/71  Pulse: 69 86 86 80  Resp: 17 20 20 15   Temp: (!) 97.5 F (36.4 C) 98.1 F (36.7 C) (!) 97.5 F (36.4 C) 98.4 F (36.9 C)  TempSrc: Oral Oral Oral Oral  SpO2: 94% (!) 88% 95% 94%  Height:  6' (1.829 m)      Intake/Output Summary (Last 24 hours) at 10/07/2021 1407 Last data filed at 10/07/2021 10/09/2021 Gross per 24 hour  Intake 1240 ml  Output 10 ml  Net 1230 ml   There were no vitals filed for this visit.  Examination:  Awake Alert, Oriented X 3, No new F.N deficits, Normal affect Symmetrical Chest wall movement, Good air movement bilaterally, CTAB RRR,No Gallops,Rubs or new Murmurs, Left hand bandaged    Data Reviewed: I have personally reviewed following labs and imaging studies  CBC: Recent Labs  Lab 10/05/21 1933 10/06/21 0642 10/07/21 0157  WBC 15.3* 12.5* 11.4*  NEUTROABS 11.5* 8.8*  --   HGB 15.9 15.9 16.3  HCT 47.6 47.0 47.2  MCV 89.3 89.0 89.2  PLT 381 365 360    Basic Metabolic Panel: Recent Labs  Lab 10/05/21 1933 10/06/21 0642 10/07/21 0157  NA 135 137 136  K 3.7 4.0 3.6  CL 103 106 104  CO2 22 23 24   GLUCOSE 120* 109* 136*  BUN 10 10 10   CREATININE 1.07 0.96 1.12  CALCIUM 9.0 9.0 8.6*  MG  --  1.9  --   PHOS  --  3.1  --     GFR: CrCl cannot be calculated (Unknown ideal weight.).  Liver Function Tests: Recent Labs  Lab 10/05/21 1933 10/06/21 0642  AST 20 19  ALT 28 25  ALKPHOS 111 109  BILITOT 0.4 0.8  PROT 6.9 6.6  ALBUMIN 3.2* 3.0*    CBG: No results for input(s): "GLUCAP" in the last 168 hours.   Recent Results (from the past 240 hour(s))  Culture, blood (Routine X 2) w Reflex to ID Panel     Status: None (Preliminary result)   Collection Time: 10/06/21  6:40 AM   Specimen: BLOOD RIGHT HAND  Result Value Ref Range Status   Specimen Description BLOOD RIGHT HAND  Final   Special Requests   Final    BOTTLES DRAWN AEROBIC AND ANAEROBIC Blood  Culture adequate volume   Culture   Final    NO GROWTH < 24 HOURS Performed at [facility], [address], [place], [place] [address]    Report Status PENDING  Incomplete  Culture, blood (Routine X 2) w Reflex to ID Panel     Status: None (Preliminary result)   Collection Time: 10/06/21  6:42 AM   Specimen: BLOOD  Result Value Ref Range Status   Specimen Description BLOOD LEFT ANTECUBITAL  Final   Special Requests   Final    BOTTLES DRAWN AEROBIC AND ANAEROBIC Blood Culture adequate volume   Culture   Final    NO GROWTH < 24 HOURS Performed at [facility], [address], [place], [place] [address]    Report Status PENDING  Incomplete  Aerobic/Anaerobic Culture w Gram Stain (surgical/deep wound)     Status: None (Preliminary result)   Collection Time: 10/06/21  7:26 PM   Specimen: PATH Other; Tissue  Result Value Ref Range Status   Specimen Description WOUND  Final   Special Requests LEFT HAND  Final   Gram Stain   Final    FEW WBC PRESENT, PREDOMINANTLY PMN RARE GRAM POSITIVE COCCI    Culture   Final    TOO YOUNG TO READ Performed at [facility], [address] [place], [place] [address]    Report Status PENDING  Incomplete         Radiology Studies: DG Hand Complete Left  Result Date: 10/06/2021 CLINICAL DATA:  56 year old male with infection. EXAM: LEFT HAND - COMPLETE 3+ VIEW COMPARISON:  Left thumb series 08/15/2021. FINDINGS: Generalized soft tissue swelling thenar eminence. No soft tissue gas identified. No radiopaque foreign body identified. But there is cortical osteolysis along the lateral base of the thumb metacarpal (image 2). Other carpal and metacarpal bones appear intact. Phalanges, distal radius and ulna appear intact. IMPRESSION: Positive for Left thenar soft tissue swelling and erosion of the base of the 1st metacarpal compatible with Active Osteomyelitis. Electronically Signed   By: Odessa Fleming M.D.   On: 10/06/2021 06:00         Scheduled Meds:  enoxaparin (LOVENOX) injection  40 mg Subcutaneous Q24H   nicotine  21 mg Transdermal Daily   Continuous Infusions:  vancomycin 1,000 mg (10/07/21 0405)     LOS: 1 day       Huey Bienenstock, MD [facility]   To contact the attending provider between 7A-7P or the covering provider during after hours 7P-7A, please log into the web site [email or web address] and access using universal Patrick password for that web site. If you do not have the password, please call the hospital operator.  10/07/2021, 2:07 PM

## 2021-10-07 NOTE — Progress Notes (Signed)
Patient told this RN that he was checking out. Told the patient that I would have to page the doctor. He said okay. Patient stated that he need rx for pain meds and abx or they can just cut it off. He didn't care.   Paged Dr. Arlean Hopping

## 2021-10-07 NOTE — Progress Notes (Signed)
TRH night cross cover note:   I was notified by RN of the patient's request for additional pain medication as it relates to his I&D site following this procedure last evening.  The patient notes suboptimal pain control with existing Dilaudid 0.5 mg IV every 4 hours as needed, including following a dose of oxycodone IR 5 mg.  I subsequently modified the above order for Dilaudid to reflect 1.0 mg IV every 3 hours as needed, and notified patient's RN that is okay to give this updated dose now.      Newton Pigg, DO Hospitalist

## 2021-10-07 NOTE — Progress Notes (Signed)
   Subjective:  No acute events overnight.  Resting comfortably this AM.  Pain poorly controlled overnight but seems improved this AM.  Denies any systemic symptoms.   Objective:   VITALS:   Vitals:   10/06/21 2109 10/06/21 2308 10/07/21 0412 10/07/21 0758  BP: (!) 169/77 (!) 168/72 137/74 140/71  Pulse: 69 86 86 80  Resp: 17 20 20 15   Temp: (!) 97.5 F (36.4 C) 98.1 F (36.7 C) (!) 97.5 F (36.4 C) 98.4 F (36.9 C)  TempSrc: Oral Oral Oral Oral  SpO2: 94% (!) 88% 95% 94%  Height:  6' (1.829 m)      Gen: NAD, resting comfortably Pulm: Normal WOB on RA CV: BUE warm and well perfused, normal rate L hand: dressing clean and dry, no TTP along flexor tendon sheath, diminished sensation in thumb that is unchanged from initial presentation, thumb warm and well perfused    Lab Results  Component Value Date   WBC 11.4 (H) 10/07/2021   HGB 16.3 10/07/2021   HCT 47.2 10/07/2021   MCV 89.2 10/07/2021   PLT 360 10/07/2021     Assessment/Plan:  56 yo LHD M now POD 1 s/p I&D of postoperative infection of left thumb at the [facility] joint.  Scant purulence from pin sites.  No purulence in [facility] joint but some bony destruction at dorsal thumb MC base.    Wound closed with two sutures with multiple penrose drains in place Will start local wound care tomorrow morning Intrao cultures growing GPCs, will continue to follow for speciation Continue IV abx No plans for additional surgical procedures   [facility], MD 10/07/2021, 11:14 AM (828) 10/09/2021

## 2021-10-07 NOTE — Consult Note (Signed)
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 Xray and surgical finding and nature of infection all c/w bone/joint involvement  Wound cx gram stain showing gpc likely staph aureus. Given external pinning would cover for pseudomonas/gram negative as well pending final cx  Anticipated at least 6 weeks treatment  Pending final culture/susceptibility (anticipate another 3-4 days), if there is a suitable oral agent to be used we will not need IV abx     Plan: Defer picc for now Follow up on final operative culture Continue empiric vancomycin; add cefepime Will adjust abx pending final culture/sensitivity I will also see him once discharged, on 7/11 @ rcid @ 330pm Discussed with primary care   I spent 75 minute reviewing data/chart, and coordinating care and >50% direct face to face time providing counseling/discussing diagnostics/treatment plan with patient      ------------------------------------------------ Principal Problem:   Wound infection    HPI: TAL KEMPKER is a 56 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 When he unwrap the dressing, the pin fell off  He called his orthopedic surgeon who asked for him to come in  to the hospital for management however patient wasn't able to get a ride until 6/19  He denies fever, chill  He wasn't taking abx prior to this admission  On admission afebrile; normal hemodynamics; wbc 15 Xray showed bony erosion and soft tissue of the thumb He was taken to the OR the same day for I&D  I reviewed operative note/finding: "There was purulence from the pin sites but no frank purulence in the [facility] joint or deeper aspects of the wound.  There was some bony destruction of the dorsal thumb metacarpal base.  All nonviable appearing tissue was removed using a rondure.  There was some purulence coming from the percutaneously placed K wire site at the ulnar aspect of the thumb metacarpal base.  This area was opened.  There is no deeper purulence.  The wound was thoroughly irrigated with copious sterile saline via low flow cystoscopy tubing.  Following a thorough irrigation and debridement, 2 nylon sutures were placed in the dorsal wound.  2 Penrose drains were placed in this main surgical site.  A third Penrose drain was placed in the previously opened pin site.  The tourniquet was let down hemostasis was achieved with direct pressure of the wound.  The wound was dressed with Xeroform, 4 Kerlix, and an Ace wrap.  The fingers are all warm, pink, and well-perfused."   Admission blood cx is ngtd Wound cx is in progress; gram stain showed gpc   History reviewed. No pertinent family history.  Social History   Tobacco Use   Smoking status: Every Day    Packs/day: 1.00    Years:  40.00    Total pack years: 40.00    Types: Cigarettes   Smokeless tobacco: Never  Vaping Use   Vaping Use: Never used  Substance Use Topics   Alcohol use: Yes    Comment: 1 time a month either wine, liquor,beer   Drug use: Never    No Known Allergies  Review of Systems: ROS All Other ROS was negative, except mentioned above   Past Medical History:  Diagnosis Date   Hepatitis    Hep C- went away    Umbilical hernia        Scheduled Meds:  enoxaparin (LOVENOX) injection  40 mg Subcutaneous Q24H   nicotine  21 mg Transdermal Daily   Continuous Infusions:  ceFEPime (MAXIPIME) IV     vancomycin 1,000 mg (10/07/21 0405)   PRN Meds:.acetaminophen, HYDROmorphone (DILAUDID) injection, mouth rinse, oxyCODONE, polyethylene glycol   OBJECTIVE: Blood pressure 140/71, pulse 80, temperature 98.4 F (36.9 C), temperature source Oral, resp. rate 15, height 6' (1.829 m), SpO2 94 %.  Physical Exam General/constitutional: no distress, pleasant HEENT: Normocephalic, PER, Conj Clear, EOMI, Oropharynx clear Neck supple CV: rrr no mrg Lungs: clear to auscultation, normal respiratory effort Abd: Soft, Nontender Ext: no edema Skin: No Rash -- reviewed picture from admission  Neuro: nonfocal MSK: left hand dressing intact; no surrounding cellulitis; tender on light palpation over dressing      Lab Results Lab Results  Component Value Date   WBC 11.4 (H) 10/07/2021   HGB 16.3 10/07/2021   HCT 47.2 10/07/2021   MCV 89.2 10/07/2021   PLT 360 10/07/2021    Lab Results  Component Value Date   CREATININE 1.12 10/07/2021   BUN 10 10/07/2021   NA 136 10/07/2021   K 3.6 10/07/2021   CL 104 10/07/2021   CO2 24 10/07/2021    Lab Results  Component Value Date   ALT 25 10/06/2021   AST 19 10/06/2021   ALKPHOS 109 10/06/2021   BILITOT 0.8 10/06/2021      Microbiology: Recent Results (from the past 240 hour(s))  Culture, blood (Routine X 2) w Reflex to ID Panel     Status: None (Preliminary result)   Collection Time: 10/06/21  6:40 AM   Specimen: BLOOD RIGHT HAND  Result Value Ref Range Status   Specimen Description BLOOD RIGHT HAND  Final   Special Requests   Final    BOTTLES DRAWN AEROBIC AND ANAEROBIC Blood Culture adequate volume   Culture   Final    NO GROWTH < 24 HOURS Performed at [facility], [address], [place], [place] [address]    Report Status  PENDING  Incomplete  Culture, blood (Routine X 2) w Reflex to ID Panel     Status: None (Preliminary result)   Collection Time: 10/06/21  6:42 AM   Specimen: BLOOD  Result Value Ref Range Status   Specimen Description BLOOD LEFT ANTECUBITAL  Final   Special Requests   Final    BOTTLES DRAWN AEROBIC AND ANAEROBIC Blood Culture adequate volume   Culture   Final    NO GROWTH < 24 HOURS Performed at [facility], [address] [place], [place] [address]    Report Status PENDING  Incomplete  Aerobic/Anaerobic Culture w Gram Stain (surgical/deep wound)     Status: None (Preliminary result)   Collection Time: 10/06/21  7:26 PM   Specimen: PATH Other; Tissue  Result Value Ref Range Status   Specimen Description WOUND  Final  Special Requests LEFT HAND  Final   Gram Stain   Final    FEW WBC PRESENT, PREDOMINANTLY PMN RARE GRAM POSITIVE COCCI    Culture   Final    TOO YOUNG TO READ Performed at [facility], [address], [place], [place] [address]    Report Status PENDING  Incomplete     Serology:    Imaging: If present, new imagings (plain films, ct scans, and mri) have been personally visualized and interpreted; radiology reports have been reviewed. Decision making incorporated into the Impression / Recommendations.  6/19 xray left hand  Positive for Left thenar soft tissue swelling and erosion of the base of the 1st metacarpal compatible with Active Osteomyelitis.  Raymondo Band, MD [facility] [phone] pager    10/07/2021, 3:23 PM

## 2021-10-07 NOTE — Progress Notes (Signed)
TRH night cross cover note:   I was notified by RN that this patient wishes to leave AMA.   I subsequently evaluated the patient at bedside, noting that he is alert and oriented x4.  I strongly encouraged the patient to remain in the hospital for ongoing evaluation and management of his left hand infection, including ongoing IV antibiotics.  I conveyed to the patient that in leaving the hospital AMA at this time, that there are associated risks for worsening infection, bacteremia, hypotension, and also an increased risk for death.  The patient verbally conveyed his understanding of these risks, as well as his willingness to accept these risks, and subsequently reiterated his desire to leave the hospital AMA.  I also conveyed to the patient that I would not be providing him with any scripts for pain medication or oral antibiotics.  And he also verbalized his understanding of this.   Patient subsequently signed the AMA form, and left the hospital AMA.    Newton Pigg, DO Hospitalist

## 2021-10-07 NOTE — Op Note (Signed)
Date of Surgery: 10/07/2021  INDICATIONS: Patient is a 56 y.o.-year-old male with a post surgical infection of the left thumb following open reduction and percutaneous pinning of a chronic CMC dislocation.  Patient reached out to me last week regarding increased swelling, erythema, and drainage from the surgical site.  Per patient's report, both k-wires fell out.  I recommended that he come to the ER or my office to be evaluated.  He was unable to get a ride until Sunday night.  He was found to have purulent drainage from the pin sites with surrounding erythema and induration so irrigation and debridement was recommended.   Risks, benefits, and alternatives to surgery were again discussed with the patient in the preoperative area. The patient wishes to proceed with surgery.  Informed consent was signed after our discussion.   PREOPERATIVE DIAGNOSIS:  Right dorsal thumb infection  POSTOPERATIVE DIAGNOSIS: Same.  PROCEDURE: Irrigation and debridement of right thumb   SURGEON: Audria Nine, M.D.  ASSIST:   ANESTHESIA:  general  IV FLUIDS AND URINE: See anesthesia.  ESTIMATED BLOOD LOSS: 5 mL.  IMPLANTS: * No implants in log *   DRAINS: Penrose drains x 3  COMPLICATIONS: None  SPECIMENS: Culture swab  DESCRIPTION OF PROCEDURE: The patient was met in the preoperative holding area where the surgical site was marked and the consent form was verified.  The patient was then taken to the operating room and transferred to the operating table.  All bony prominences were well padded.  A tourniquet was applied to the left upper arm.  General endotracheal anesthesia was induced.  The operative extremity was prepped and draped in the usual and sterile fashion.  A formal time-out was performed to confirm that this was the correct patient, surgery, side, and site.   Following timeout, the limb was exsanguinated with gravity and the tourniquet inflated to 250 mmHg.  The previous sutures were  removed.  A longitudinal incision was made directly over his previous incision.  Blunt dissection was used to reach the base of the thumb metacarpal.  The previously placed braided suture was removed.  There was purulence from the pin sites but no frank purulence in the [facility] joint or deeper aspects of the wound.  There was some bony destruction of the dorsal thumb metacarpal base.  All nonviable appearing tissue was removed using a rondure.  There was some purulence coming from the percutaneously placed K wire site at the ulnar aspect of the thumb metacarpal base.  This area was opened.  There is no deeper purulence.  The wound was thoroughly irrigated with copious sterile saline via low flow cystoscopy tubing.  Following a thorough irrigation and debridement, 2 nylon sutures were placed in the dorsal wound.  2 Penrose drains were placed in this main surgical site.  A third Penrose drain was placed in the previously opened pin site.  The tourniquet was let down hemostasis was achieved with direct pressure of the wound.  The wound was dressed with Xeroform, 4 Kerlix, and an Ace wrap.  The fingers are all warm, pink, and well-perfused.    The patient was then reversed from anesthesia and extubated uneventfully.  He was transferred the postoperative bed.  All counts were correct at the end the procedure.  The patient was then taken the PACU in stable condition.  POSTOPERATIVE PLAN: He will be admitted to the hospitalist service.  Intra-Op cultures were obtained and will be followed up.  He will continue broad-spectrum IV antibiotics  until culture results return.  We will start wound care 24 hours after surgery.  Audria Nine, MD 1:09 PM

## 2021-10-07 NOTE — Progress Notes (Signed)
Dr. Arlean Hopping to bedside. Explained the risks of leaving. Patient voiced understanding. Signed AMA form. IV removed

## 2021-10-07 NOTE — Progress Notes (Signed)
Pt reports no relief of pain from 0.5 mg IV dilaudid or 5 mg of oxycodone. Paged MD to request additional meds. Pt's hand is warm, has normal sensation, radial pulse, and movement. Just extremely sore due to I&D done last evening.

## 2021-10-08 ENCOUNTER — Other Ambulatory Visit: Payer: Self-pay | Admitting: Orthopedic Surgery

## 2021-10-08 MED ORDER — SULFAMETHOXAZOLE-TRIMETHOPRIM 800-160 MG PO TABS: 1.0000 | ORAL_TABLET | Freq: Two times a day (BID) | ORAL | 0 refills | Status: DC

## 2021-10-08 MED ORDER — OXYCODONE HCL 5 MG PO TABS: 5.0000 mg | ORAL_TABLET | Freq: Four times a day (QID) | ORAL | 0 refills | Status: AC | PRN

## 2021-10-08 NOTE — Progress Notes (Signed)
From chart review overnight, patient left AMA.  Was not discharged with any antibiotic prescription.  Surgical dressing still in place with no discussion with patient regarding wound care.  Will try to reach out to patient to discuss returning to the hospital.  He's had significant issues with transportation in the past and was unable to get to my office or the hospital in a timely fashion once a postop infection was suspected.  If necessary and patient unable to return to hospital, will prescribe oral antibiotics appropriate for current S. Aureus culture results and will discuss wound care regimen with patient.   Marlyne Beards, M.D. [facility]

## 2021-10-09 ENCOUNTER — Other Ambulatory Visit: Payer: Self-pay | Admitting: Orthopedic Surgery

## 2021-10-09 DIAGNOSIS — L089 Local infection of the skin and subcutaneous tissue, unspecified: Secondary | ICD-10-CM

## 2021-10-09 DIAGNOSIS — S63045A Dislocation of carpometacarpal joint of left thumb, initial encounter: Secondary | ICD-10-CM

## 2021-10-09 MED ORDER — DOXYCYCLINE HYCLATE 50 MG PO CAPS: 100.0000 mg | ORAL_CAPSULE | Freq: Two times a day (BID) | ORAL | 0 refills | Status: AC

## 2021-10-09 NOTE — Progress Notes (Signed)
Spoke with infectious disease regarding patient's culture results.  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  Have sent patient a new prescription for doxycyline to which the MRSA strain seems sensitive.  I have also placed an urgent referral to the ID clinic as patient may be a candidate for a dose of long active dalbavancin.  Attempted to call patient with this information but no answer and voice mail not working.

## 2021-10-11 LAB — AEROBIC/ANAEROBIC CULTURE W GRAM STAIN (SURGICAL/DEEP WOUND)

## 2021-10-11 LAB — CULTURE, BLOOD (ROUTINE X 2)
Culture: NO GROWTH
Culture: NO GROWTH
Special Requests: ADEQUATE
Special Requests: ADEQUATE

## 2021-10-13 ENCOUNTER — Telehealth: Payer: Self-pay

## 2021-10-28 ENCOUNTER — Inpatient Hospital Stay: Payer: 59 | Admitting: Internal Medicine

## 2021-11-14 ENCOUNTER — Ambulatory Visit (INDEPENDENT_AMBULATORY_CARE_PROVIDER_SITE_OTHER): Payer: 59 | Admitting: Orthopedic Surgery

## 2021-11-14 ENCOUNTER — Ambulatory Visit: Payer: Self-pay

## 2021-11-14 DIAGNOSIS — S63045A Dislocation of carpometacarpal joint of left thumb, initial encounter: Secondary | ICD-10-CM

## 2021-11-14 DIAGNOSIS — L089 Local infection of the skin and subcutaneous tissue, unspecified: Secondary | ICD-10-CM

## 2021-11-14 DIAGNOSIS — T148XXA Other injury of unspecified body region, initial encounter: Secondary | ICD-10-CM
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
## 2021-11-20 ENCOUNTER — Telehealth: Payer: Self-pay | Admitting: Orthopedic Surgery

## 2021-11-20 NOTE — Telephone Encounter (Addendum)
Patient called asked what is is his next plan of care for his left hand. Patient said his hand hurts pretty bad. Patient asked if he can get something called into his pharmacy for pain? Patient uses [facility] in [place].  Patient said he just got out of jail and give Dr. Frazier Butt verbal permission to speak with his first cousin -Quintin Alto in reference to anything concerning him or his care.  Phone # is [phone]   Patient said he does not have a number to reach him direct right now.

## 2021-11-21 ENCOUNTER — Telehealth: Payer: Self-pay | Admitting: Orthopedic Surgery

## 2021-11-21 ENCOUNTER — Telehealth: Payer: Self-pay | Admitting: Internal Medicine

## 2021-11-21 NOTE — Telephone Encounter (Signed)
Patient returned call to Dr. Frazier Butt  asked for a call back. The number to contact patient is     [phone]

## 2021-11-21 NOTE — Telephone Encounter (Signed)
Pt returned call to Dr. Frazier Butt. Informed pt he is in a room with pt and to listen out for his phone. Pt phone number is [phone]

## 2021-11-21 NOTE — Telephone Encounter (Signed)
Pt called and is wondering also if he can get some pain medicine.   CB [phone]

## 2021-11-21 NOTE — Telephone Encounter (Signed)
Spoke with dr. 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 Patient can be reached through his cousin, Andre Lefort  [phone] -- patient doesn't have active phone number.  Recommend: dalbavancin x 2 followed by doxy.  Will reach out to patient and have him coordinated back into care.

## 2021-11-21 NOTE — Telephone Encounter (Signed)
Left voicemail with Nicholas Fischer asking her to return my call.   [name] Lesli Albee, CMA

## 2021-11-24 ENCOUNTER — Telehealth: Payer: Self-pay | Admitting: Orthopedic Surgery

## 2021-11-24 NOTE — Telephone Encounter (Signed)
Patient returned call and is scheduled with Dr. Daiva Eves 8/16. He is asking for pain medication, advised him to call his surgeon's office to discuss pain management.   He does not have a reliable phone and has had difficulty getting in touch with their office, transferred patient to Dr. Frazier Butt.   Sandie Ano, RN

## 2021-11-24 NOTE — Telephone Encounter (Signed)
Patient called. Would like some pain medication called in for him and antibiotic.

## 2021-11-24 NOTE — Telephone Encounter (Signed)
Called Marylene Land to schedule patient, no answer. Left HIPAA compliant voicemail requesting callback.   Sandie Ano, RN

## 2021-11-24 NOTE — Telephone Encounter (Signed)
Marylene Land returned call, she has not heard from [place] for a few days but says he is supposed to be staying in touch with her. Provided Marylene Land with office phone number and requested she have [place] call to make an appointment.   Sandie Ano, RN

## 2021-12-03 ENCOUNTER — Ambulatory Visit: Payer: 59 | Admitting: Infectious Disease

## 2023-01-26 ENCOUNTER — Encounter (HOSPITAL_COMMUNITY): Payer: Self-pay | Admitting: Orthopedic Surgery
